# Patient Record
Sex: Male | Born: 1970
Health system: Southern US, Community
[De-identification: ages and names within clinical notes are randomized; demographics above are authoritative.]

## PROBLEM LIST (undated history)

## (undated) DIAGNOSIS — K5792 Diverticulitis of intestine, part unspecified, without perforation or abscess without bleeding: Secondary | ICD-10-CM

## (undated) DIAGNOSIS — B009 Herpesviral infection, unspecified: Secondary | ICD-10-CM

## (undated) HISTORY — PX: OTHER SURGICAL HISTORY: SHX169

---

## 2002-01-26 ENCOUNTER — Emergency Department (HOSPITAL_COMMUNITY): Admission: EM | Admit: 2002-01-26 | Discharge: 2002-01-26 | Payer: Self-pay | Admitting: Emergency Medicine

## 2003-04-21 ENCOUNTER — Emergency Department (HOSPITAL_COMMUNITY): Admission: EM | Admit: 2003-04-21 | Discharge: 2003-04-21 | Payer: Self-pay | Admitting: *Deleted

## 2003-04-21 ENCOUNTER — Encounter: Payer: Self-pay | Admitting: Emergency Medicine

## 2003-05-01 ENCOUNTER — Emergency Department (HOSPITAL_COMMUNITY): Admission: EM | Admit: 2003-05-01 | Discharge: 2003-05-01 | Payer: Self-pay | Admitting: Emergency Medicine

## 2007-03-16 ENCOUNTER — Encounter: Admission: RE | Admit: 2007-03-16 | Discharge: 2007-03-16 | Payer: Self-pay | Admitting: Family Medicine

## 2013-04-16 ENCOUNTER — Emergency Department (HOSPITAL_COMMUNITY)
Admission: EM | Admit: 2013-04-16 | Discharge: 2013-04-16 | Disposition: A | Discharge status: Home / Self Care | Payer: BC Managed Care – PPO | Attending: Emergency Medicine | Admitting: Emergency Medicine

## 2013-04-16 ENCOUNTER — Encounter (HOSPITAL_COMMUNITY): Payer: Self-pay | Admitting: Emergency Medicine

## 2013-04-16 DIAGNOSIS — R109 Unspecified abdominal pain: Secondary | ICD-10-CM

## 2013-04-16 DIAGNOSIS — Z87891 Personal history of nicotine dependence: Secondary | ICD-10-CM | POA: Insufficient documentation

## 2013-04-16 NOTE — ED Notes (Signed)
Pt presents to ED with a  Complaint of abdominal pain x 3 days.  Pt indicates that the pain arrives in waves at his lower medial abdomen and upper pelvis area.  Pt describes pain as sore.

## 2013-04-16 NOTE — ED Notes (Signed)
Pt states he has waves of pain in lower abdomen and it is sore to touch,  "feels like gas",  Pt states his stools have changed some they aren't loose but they are not solid.

## 2013-04-16 NOTE — ED Provider Notes (Signed)
CSN: [id number]     Arrival date & time 04/16/13  2050 History   First MD Initiated Contact with Patient 04/16/13 2143     Chief Complaint  Patient presents with  . Abdominal Pain   (Consider location/radiation/quality/duration/timing/severity/associated sxs/prior Treatment) HPI Comments: MORTON Tyrone Costa is a 42 y.o. Male who developed crampy abdominal pain. 3 days ago, associated with decreased stooling. He denies nausea, vomiting, fever, chills, rectal bleeding, dysuria, urinary frequency, or weakness. He has been able to eat, but is somewhat less hungry today. The pain is in his low mid abdomen. He has not had this previously. There are no other known modifying factors.   Patient is a 42 y.o. male presenting with abdominal pain. The history is provided by the patient.  Abdominal Pain   History reviewed. No pertinent past medical history. History reviewed. No pertinent past surgical history. History reviewed. No pertinent family history. History  Substance Use Topics  . Smoking status: Former Games developer  . Smokeless tobacco: Not on file  . Alcohol Use: Yes    Review of Systems  Gastrointestinal: Positive for abdominal pain.  All other systems reviewed and are negative.    Allergies  Review of patient's allergies indicates no known allergies.  Home Medications  No current outpatient prescriptions on file. BP 131/85  Pulse 100  Temp(Src) 99.3 F (37.4 C) (Oral)  Resp 16  SpO2 96% Physical Exam  Nursing note and vitals reviewed. Constitutional: He is oriented to person, place, and time. He appears well-developed and well-nourished. No distress.  HENT:  Head: Normocephalic and atraumatic.  Right Ear: External ear normal.  Left Ear: External ear normal.  Eyes: Conjunctivae and EOM are normal. Pupils are equal, round, and reactive to light.  Neck: Normal range of motion and phonation normal. Neck supple.  Cardiovascular: Normal rate, regular rhythm, normal heart sounds and  intact distal pulses.   Pulmonary/Chest: Effort normal and breath sounds normal. He exhibits no bony tenderness.  Abdominal: Soft. Normal appearance. He exhibits no mass. There is tenderness (Mild mid suprapubic tenderness). There is no rebound and no guarding.  Genitourinary:  Is normal. Digital rectal examination reveals brown stool; and small, firm prostate, which is nontender.  Musculoskeletal: Normal range of motion.  Neurological: He is alert and oriented to person, place, and time. He has normal strength. No cranial nerve deficit or sensory deficit. He exhibits normal muscle tone. Coordination normal.  Skin: Skin is warm, dry and intact.  Psychiatric: He has a normal mood and affect. His behavior is normal. Judgment and thought content normal.    ED Course  Procedures (including critical care time)  Patient was interested in an extensive discussion about possibilities, and risk factor for serious disease. I discussed getting CT scan to further evaluate and differentiate the physical findings. He felt like his pain was low, 4/10, at 2200 hours. He did not feel he needed further evaluation, at this time. He has access to followup with his primary care physician at [facility]. He would prefer following up with them if his symptoms do not improve. We discussed the possibility of decreased stooling frequency as being involved with his discomfort. He is going to try a stool softener, and oral OTC analgesia for the discomfort. We discussed worrisome factors, that should warn him to seek care; these would include increased pain, rectal bleeding, vomiting, fever, weakness.    MDM   1. Abdominal pain, acute      Nonspecific low abdominal pain. The worrisome  findings on physical examination. Doubt UTI, prostatitis, gastroenteritis, colitis, appendicitis. He, stable for discharge with expectant management and followup  Nursing Notes Reviewed/ Care Coordinated, and agree without  changes. Applicable Imaging Reviewed.  Interpretation of Laboratory Data incorporated into ED treatment   Plan: Home Medications- Colace for 3-4 days, Tylenol/Advil prn pain; Home Treatments and Observation- Rest, Fluids; return here if the recommended treatment, does not improve the symptoms; Recommended follow up- PCP prn, return here if needed.      Flint Melter, MD 04/16/13 2215

## 2015-11-01 ENCOUNTER — Encounter (HOSPITAL_COMMUNITY): Payer: Self-pay | Admitting: Emergency Medicine

## 2015-11-01 ENCOUNTER — Emergency Department (HOSPITAL_COMMUNITY): Payer: BLUE CROSS/BLUE SHIELD

## 2015-11-01 ENCOUNTER — Inpatient Hospital Stay (HOSPITAL_COMMUNITY)
Admission: EM | Admit: 2015-11-01 | Discharge: 2015-11-02 | DRG: 392 | Disposition: A | Discharge status: Home / Self Care | Payer: BLUE CROSS/BLUE SHIELD | Attending: Internal Medicine | Admitting: Internal Medicine

## 2015-11-01 DIAGNOSIS — K5792 Diverticulitis of intestine, part unspecified, without perforation or abscess without bleeding: Secondary | ICD-10-CM | POA: Diagnosis present

## 2015-11-01 DIAGNOSIS — A6000 Herpesviral infection of urogenital system, unspecified: Secondary | ICD-10-CM | POA: Diagnosis not present

## 2015-11-01 DIAGNOSIS — Z8709 Personal history of other diseases of the respiratory system: Secondary | ICD-10-CM | POA: Diagnosis not present

## 2015-11-01 DIAGNOSIS — K5720 Diverticulitis of large intestine with perforation and abscess without bleeding: Secondary | ICD-10-CM | POA: Diagnosis not present

## 2015-11-01 DIAGNOSIS — Z8619 Personal history of other infectious and parasitic diseases: Secondary | ICD-10-CM | POA: Diagnosis not present

## 2015-11-01 DIAGNOSIS — R031 Nonspecific low blood-pressure reading: Secondary | ICD-10-CM | POA: Diagnosis present

## 2015-11-01 DIAGNOSIS — K5732 Diverticulitis of large intestine without perforation or abscess without bleeding: Secondary | ICD-10-CM | POA: Diagnosis not present

## 2015-11-01 DIAGNOSIS — Z87891 Personal history of nicotine dependence: Secondary | ICD-10-CM

## 2015-11-01 DIAGNOSIS — R1032 Left lower quadrant pain: Secondary | ICD-10-CM | POA: Diagnosis not present

## 2015-11-01 DIAGNOSIS — J45909 Unspecified asthma, uncomplicated: Secondary | ICD-10-CM

## 2015-11-01 HISTORY — DX: Herpesviral infection, unspecified: B00.9

## 2015-11-01 LAB — COMPREHENSIVE METABOLIC PANEL
ALT: 41 U/L (ref 17–63)
AST: 30 U/L (ref 15–41)
Albumin: 3.9 g/dL (ref 3.5–5.0)
Alkaline Phosphatase: 46 U/L (ref 38–126)
Anion gap: 13 (ref 5–15)
BUN: 6 mg/dL (ref 6–20)
CO2: 24 mmol/L (ref 22–32)
Calcium: 9.2 mg/dL (ref 8.9–10.3)
Chloride: 104 mmol/L (ref 101–111)
Creatinine, Ser: 0.94 mg/dL (ref 0.61–1.24)
GFR calc Af Amer: >60 mL/min (ref >60)
GFR calc non Af Amer: >60 mL/min (ref >60)
Glucose, Bld: 111 mg/dL — ABNORMAL HIGH (ref 65–99)
Potassium: 4.1 mmol/L (ref 3.5–5.1)
Sodium: 141 mmol/L (ref 135–145)
Total Bilirubin: 0.8 mg/dL (ref 0.3–1.2)
Total Protein: 7.4 g/dL (ref 6.5–8.1)

## 2015-11-01 LAB — CBC
HCT: 42.9 % (ref 39.0–52.0)
Hemoglobin: 14.9 g/dL (ref 13.0–17.0)
MCH: 32.3 pg (ref 26.0–34.0)
MCHC: 34.7 g/dL (ref 30.0–36.0)
MCV: 92.9 fL (ref 78.0–100.0)
Platelets: 312 10*3/uL (ref 150–400)
RBC: 4.62 MIL/uL (ref 4.22–5.81)
RDW: 12.1 % (ref 11.5–15.5)
WBC: 10.3 10*3/uL (ref 4.0–10.5)

## 2015-11-01 LAB — URINALYSIS, ROUTINE W REFLEX MICROSCOPIC
Bilirubin Urine: NEGATIVE
Glucose, UA: NEGATIVE mg/dL
Hgb urine dipstick: NEGATIVE
Ketones, ur: NEGATIVE mg/dL
Leukocytes, UA: NEGATIVE
Nitrite: NEGATIVE
Protein, ur: NEGATIVE mg/dL
Specific Gravity, Urine: 1.003 — ABNORMAL LOW (ref 1.005–1.030)
pH: 6.5 (ref 5.0–8.0)

## 2015-11-01 LAB — DIFFERENTIAL
Basophils Absolute: 0 10*3/uL (ref 0.0–0.1)
Basophils Relative: 0 %
Eosinophils Absolute: 0.2 10*3/uL (ref 0.0–0.7)
Eosinophils Relative: 2 %
Lymphocytes Relative: 24 %
Lymphs Abs: 2.5 10*3/uL (ref 0.7–4.0)
Monocytes Absolute: 1.2 10*3/uL — ABNORMAL HIGH (ref 0.1–1.0)
Monocytes Relative: 12 %
Neutro Abs: 6.4 10*3/uL (ref 1.7–7.7)
Neutrophils Relative %: 62 %

## 2015-11-01 LAB — LIPASE, BLOOD: Lipase: 21 U/L (ref 11–51)

## 2015-11-01 MED ORDER — IOHEXOL 300 MG/ML  SOLN
50.0000 mL | Freq: Once | INTRAMUSCULAR | Status: AC | PRN
  Administered 2015-11-01: 50 mL via ORAL

## 2015-11-01 MED ORDER — ENOXAPARIN SODIUM 40 MG/0.4ML ~~LOC~~ SOLN
40.0000 mg | SUBCUTANEOUS | Status: DC
  Administered 2015-11-01: 40 mg via SUBCUTANEOUS
  Filled 2015-11-01 (×2): qty 0.4

## 2015-11-01 MED ORDER — ONDANSETRON HCL 4 MG/2ML IJ SOLN: 4.0000 mg | Freq: Three times a day (TID) | INTRAMUSCULAR | Status: DC | PRN

## 2015-11-01 MED ORDER — SODIUM CHLORIDE 0.9% FLUSH
3.0000 mL | Freq: Two times a day (BID) | INTRAVENOUS | Status: DC
  Administered 2015-11-01: 3 mL via INTRAVENOUS

## 2015-11-01 MED ORDER — ONDANSETRON HCL 4 MG/2ML IJ SOLN: 4.0000 mg | Freq: Four times a day (QID) | INTRAMUSCULAR | Status: DC | PRN

## 2015-11-01 MED ORDER — HYDROMORPHONE HCL 1 MG/ML IJ SOLN: 1.0000 mg | INTRAMUSCULAR | Status: DC | PRN

## 2015-11-01 MED ORDER — SODIUM CHLORIDE 0.9% FLUSH: 3.0000 mL | INTRAVENOUS | Status: DC | PRN

## 2015-11-01 MED ORDER — ALBUTEROL SULFATE HFA 108 (90 BASE) MCG/ACT IN AERS: 2.0000 | INHALATION_SPRAY | Freq: Four times a day (QID) | RESPIRATORY_TRACT | Status: DC | PRN

## 2015-11-01 MED ORDER — ONDANSETRON HCL 4 MG PO TABS: 4.0000 mg | ORAL_TABLET | Freq: Four times a day (QID) | ORAL | Status: DC | PRN

## 2015-11-01 MED ORDER — ALBUTEROL SULFATE (2.5 MG/3ML) 0.083% IN NEBU: 2.5000 mg | INHALATION_SOLUTION | Freq: Four times a day (QID) | RESPIRATORY_TRACT | Status: DC | PRN

## 2015-11-01 MED ORDER — PIPERACILLIN-TAZOBACTAM 3.375 G IVPB
3.3750 g | Freq: Three times a day (TID) | INTRAVENOUS | Status: DC
  Administered 2015-11-01 – 2015-11-02 (×2): 3.375 g via INTRAVENOUS
  Filled 2015-11-01 (×3): qty 50

## 2015-11-01 MED ORDER — HYDROMORPHONE HCL 1 MG/ML IJ SOLN: 0.5000 mg | INTRAMUSCULAR | Status: DC | PRN

## 2015-11-01 MED ORDER — IOPAMIDOL (ISOVUE-300) INJECTION 61%
100.0000 mL | Freq: Once | INTRAVENOUS | Status: AC | PRN
  Administered 2015-11-01: 100 mL via INTRAVENOUS

## 2015-11-01 MED ORDER — ACETAMINOPHEN 325 MG PO TABS: 650.0000 mg | ORAL_TABLET | Freq: Four times a day (QID) | ORAL | Status: DC | PRN

## 2015-11-01 MED ORDER — MORPHINE SULFATE (PF) 2 MG/ML IV SOLN: 2.0000 mg | INTRAVENOUS | Status: DC | PRN

## 2015-11-01 MED ORDER — SODIUM CHLORIDE 0.9 % IV SOLN: 250.0000 mL | INTRAVENOUS | Status: DC | PRN

## 2015-11-01 MED ORDER — PIPERACILLIN-TAZOBACTAM 3.375 G IVPB 30 MIN
3.3750 g | Freq: Once | INTRAVENOUS | Status: AC
  Administered 2015-11-01: 3.375 g via INTRAVENOUS
  Filled 2015-11-01: qty 50

## 2015-11-01 MED ORDER — ACETAMINOPHEN 650 MG RE SUPP: 650.0000 mg | Freq: Four times a day (QID) | RECTAL | Status: DC | PRN

## 2015-11-01 NOTE — ED Notes (Signed)
MD at bedside. EDP J EVALUATING PT

## 2015-11-01 NOTE — ED Notes (Signed)
EDPA JOSH  at bedside. Continue with Zosyn

## 2015-11-01 NOTE — ED Provider Notes (Signed)
CSN: [id number]     Arrival date & time 11/01/15  0935 History   First MD Initiated Contact with Patient 11/01/15 [phone]     Chief Complaint  Patient presents with  . Abdominal Pain     (Consider location/radiation/quality/duration/timing/severity/associated sxs/prior Treatment) HPI Comments: Patient with no past surgical history presents with complaint of lower abdominal pain beginning 11 days ago. Pain is low in his abdomen, suprapubic to slightly left-sided. It does not otherwise radiate. Patient has transiently worsening pain with urination and bowel movements. No blood in either. No true dysuria. Patient denies fever. He was treated empirically for diverticulitis in the past but has never had a colonoscopy or CT scan. No history of prostatitis. No treatments prior to arrival. Patient states that his pain originally worsened and then resolved about a week ago, however over the past 2-3 days the pain has returned and become worse. The onset of this condition was acute. The course is constant. Aggravating factors: none. Alleviating factors: none.    Patient is a 45 y.o. male presenting with abdominal pain. The history is provided by the patient.  Abdominal Pain Associated symptoms: no chest pain, no cough, no diarrhea, no dysuria, no fever, no nausea, no sore throat and no vomiting     History reviewed. No pertinent past medical history. History reviewed. No pertinent past surgical history. History reviewed. No pertinent family history. Social History  Substance Use Topics  . Smoking status: Former Games developermoker  . Smokeless tobacco: None  . Alcohol Use: Yes    Review of Systems  Constitutional: Negative for fever.  HENT: Negative for rhinorrhea and sore throat.   Eyes: Negative for redness.  Respiratory: Negative for cough.   Cardiovascular: Negative for chest pain.  Gastrointestinal: Positive for abdominal pain. Negative for nausea, vomiting and diarrhea.  Genitourinary: Negative for  dysuria, frequency, discharge, penile swelling, scrotal swelling, penile pain and testicular pain.  Musculoskeletal: Negative for myalgias.  Skin: Negative for rash.  Neurological: Negative for headaches.    Allergies  Review of patient's allergies indicates no known allergies.  Home Medications   Prior to Admission medications   Medication Sig Start Date End Date Taking? Authorizing Provider  acetaminophen (TYLENOL) 500 MG tablet Take 1,000 mg by mouth every 6 (six) hours as needed for moderate pain.   Yes Historical Provider, MD  albuterol (PROVENTIL HFA;VENTOLIN HFA) 108 (90 Base) MCG/ACT inhaler Inhale 2 puffs into the lungs every 6 (six) hours as needed for wheezing or shortness of breath.   Yes Historical Provider, MD  Ascorbic Acid (VITAMIN C PO) Take 1 tablet by mouth daily.   Yes Historical Provider, MD  B Complex Vitamins (VITAMIN B COMPLEX) TABS Take 1 tablet by mouth daily.   Yes Historical Provider, MD  valACYclovir (VALTREX) 500 MG tablet Take 500 mg by mouth 2 (two) times daily as needed (outbreaks).   Yes Historical Provider, MD   BP 146/106 mmHg  Pulse 102  Temp(Src) 97.5 F (36.4 C) (Oral)  Resp 14  SpO2 99%   Physical Exam  Constitutional: He appears well-developed and well-nourished.  HENT:  Head: Normocephalic and atraumatic.  Eyes: Conjunctivae are normal. Right eye exhibits no discharge. Left eye exhibits no discharge.  Neck: Normal range of motion. Neck supple.  Cardiovascular: Normal rate, regular rhythm and normal heart sounds.   Pulmonary/Chest: Effort normal and breath sounds normal.  Abdominal: Soft. There is tenderness in the suprapubic area and left lower quadrant. There is no rigidity, no rebound, no guarding,  no CVA tenderness, no tenderness at McBurney's point and negative Murphy's sign.    Neurological: He is alert.  Skin: Skin is warm and dry.  Psychiatric: He has a normal mood and affect.  Nursing note and vitals reviewed.   ED Course    Procedures (including critical care time) Labs Review Labs Reviewed  COMPREHENSIVE METABOLIC PANEL - Abnormal; Notable for the following:    Glucose, Bld 111 (*)    All other components within normal limits  URINALYSIS, ROUTINE W REFLEX MICROSCOPIC (NOT AT [facility]) - Abnormal; Notable for the following:    Specific Gravity, Urine 1.003 (*)    All other components within normal limits  DIFFERENTIAL - Abnormal; Notable for the following:    Monocytes Absolute 1.2 (*)    All other components within normal limits  LIPASE, BLOOD  CBC    Imaging Review Ct Abdomen Pelvis W Contrast  11/01/2015  CLINICAL DATA:  Left lower quadrant abdominal pain. EXAM: CT ABDOMEN AND PELVIS WITH CONTRAST TECHNIQUE: Multidetector CT imaging of the abdomen and pelvis was performed using the standard protocol following bolus administration of intravenous contrast. CONTRAST:  ISOVUE-300 IOPAMIDOL (ISOVUE-300) INJECTION 61% COMPARISON:  None. FINDINGS: Visualized lung bases are unremarkable. No significant osseous abnormality is noted. No gallstones are noted. The liver, spleen and pancreas unremarkable. Adrenal glands and kidneys appear normal. No hydronephrosis or renal obstruction is noted. The appendix appears normal. There is no evidence of bowel obstruction. Severe focal wall thickening and surrounding inflammation is seen involving the sigmoid colon consistent with diverticulitis. 25 x 12 mm fluid collection is seen adjacent to the colon which may represent small abscess. This is not amenable to percutaneous drainage. Urinary bladder is unremarkable. No significant adenopathy is noted. IMPRESSION: Sigmoid diverticulitis is noted. 25 x 12 mm fluid collection is seen adjacent to the sigmoid colon concerning for diverticular abscess. This is not amenable to percutaneous drainage. Electronically Signed   By: Lupita Raider, M.D.   On: 11/01/2015 12:57   I have personally reviewed and evaluated these images and lab  results as part of my medical decision-making.  10:21 AM Patient seen and examined. Work-up initiated. Medications ordered.   Vital signs reviewed and are as follows: BP 146/106 mmHg  Pulse 102  Temp(Src) 97.5 F (36.4 C) (Oral)  Resp 14  SpO2 99%  11:12 AM Patient stable. He is up in room, standing, working on laptop. Labs do not reveal cause of symptoms. I feel given recurrent nature of pain without explanation, that CT is next step to look for etiology and r/o diverticulitis. Discussed with patient and he agrees to proceed.   1:27 PM CT reviewed with patient. Will need admission for IV abx, ordered. Discussed with Dr. Ethelda Chick.   2:17 PM Spoke with Dr. Catha Gosselin. Admit med-surg obs  3:23 PM Called to patient room. Patient supine with pallor, fully responsive, diaphoretic. Patient had nausea and urge to use restroom prior. Slowly improving now. BP was low. No abdominal pain or bleeding.   MDM   Final diagnoses:  Diverticulitis of large intestine with abscess without bleeding   Admit.    Renne Crigler, PA-C 11/01/15 1417  Renne Crigler, PA-C 11/01/15 1524  Doug Sou, MD 11/01/15 [phone]

## 2015-11-01 NOTE — ED Notes (Addendum)
Tyrone DimmerKim H RN requested I evaluate pt. Pt c/o of nausea and "just not feeling right". I diaphoretic and requesting to use bathroom. Zosyn infusion stopped. Vitals recorded. Pt requested to sit up and use bathroom. BP systolic 55.  [facility] Assisted pt to lying position. EDPA Josh present to witness pt's current status. Admitting MD to

## 2015-11-01 NOTE — ED Notes (Signed)
ED PA at bedside

## 2015-11-01 NOTE — Progress Notes (Signed)
Pharmacy Antibiotic Note  Tyrone Costa is a 45 y.o. male admitted on 11/01/2015 with diverticulitis.  Pharmacy has been consulted for zosyn dosing.  Plan:  Zosyn 3.375g IV Q8H infused over 4hrs.  Do not anticipate any renal dose adjustment will be needed, therefore pharmacy will sign off.  Please re-consult if indicated.  Height: 5\' 7"  (170.2 cm) Weight: 165 lb (74.844 kg) IBW/kg (Calculated) : 66.1  Temp (24hrs), Avg:97.9 F (36.6 C), Min:97.5 F (36.4 C), Max:98.3 F (36.8 C)   Recent Labs Lab 11/01/15 1024  WBC 10.3  CREATININE 0.94    Estimated Creatinine Clearance: 93.8 mL/min (by C-G formula based on Cr of 0.94).    No Known Allergies  Antimicrobials this admission: 4/7 zosyn Dose adjustments this admission:   Microbiology results:   Thank you for allowing pharmacy to be a part of this patient's care.  Arley PhenixEllen Doninique Lwin RPh 11/01/2015, 4:40 PM Pager [phone]

## 2015-11-01 NOTE — H&P (Signed)
[facility] History and Physical  Tyrone Crankerhillip V Tremblay UUV:[id number] DOB: 1971/06/28 DOA: 11/01/2015  Referring physician: Mr. Renne CriglerJoshua Geiple, PA EDP PCP: No primary care provider on file.  Specialists:   Chief Complaint: Abdominal pain  HPI: Tyrone Costa is a 45 y.o. male  With history of possible asthma who presented to the emergency department with complaint of abdominal pain that been ongoing for approximately 2 weeks. Patient states the pain would come and go, last a few seconds at a time and dissipate for hours to days. Pain was located in the suprapubic, left lower quadrant area. Patient has been able to tolerate food and liquids without any complaints of nausea. Patient does have a history of diverticulitis but it never had colonoscopy or CT scan. Patient denies any current nausea, vomiting, chest pain, shortness of breath, ill contacts or recent travel. While in the emergency department, CT scan showed sigmoid diverticulitis with abscess. Patient was started on Zosyn. Patient also had transient hypotension preceding bowel movement. Patient's vitals currently stable on admission. [facility] called for admission.  Review of Systems:  Constitutional: Denies fever, chills, diaphoresis, appetite change and fatigue.  HEENT: Denies photophobia, eye pain, redness, hearing loss, ear pain, congestion, sore throat, rhinorrhea, sneezing, mouth sores, trouble swallowing, neck pain, neck stiffness and tinnitus.   Respiratory: Denies SOB, DOE, cough, chest tightness,  and wheezing.   Cardiovascular: Denies chest pain, palpitations and leg swelling.  Gastrointestinal: Complains of abdominal pain. Genitourinary: Denies dysuria, urgency, frequency, hematuria, flank pain and difficulty urinating.  Musculoskeletal: Denies myalgias, back pain, joint swelling, arthralgias and gait problem.  Skin: Denies pallor, rash and wound.  Neurological: Denies dizziness, seizures, syncope, weakness, light-headedness,  numbness and headaches.  Hematological: Denies adenopathy. Easy bruising, personal or family bleeding history  Psychiatric/Behavioral: Denies suicidal ideation, mood changes, confusion, nervousness, sleep disturbance and agitation  Past Medical History  Diagnosis Date  . Herpes    Past Surgical History  Procedure Laterality Date  . Left arm surgery    . Genital herpes     Social History:  reports that he has quit smoking. He has never used smokeless tobacco. He reports that he drinks alcohol. He reports that he does not use illicit drugs.   No Known Allergies  History reviewed. No pertinent family history. no family history of hypertension, coronary artery disease, diabetes.  Prior to Admission medications   Medication Sig Start Date End Date Taking? Authorizing Provider  acetaminophen (TYLENOL) 500 MG tablet Take 1,000 mg by mouth every 6 (six) hours as needed for moderate pain.   Yes Historical Provider, MD  albuterol (PROVENTIL HFA;VENTOLIN HFA) 108 (90 Base) MCG/ACT inhaler Inhale 2 puffs into the lungs every 6 (six) hours as needed for wheezing or shortness of breath.   Yes Historical Provider, MD  Ascorbic Acid (VITAMIN C PO) Take 1 tablet by mouth daily.   Yes Historical Provider, MD  B Complex Vitamins (VITAMIN B COMPLEX) TABS Take 1 tablet by mouth daily.   Yes Historical Provider, MD  valACYclovir (VALTREX) 500 MG tablet Take 500 mg by mouth 2 (two) times daily as needed (outbreaks).   Yes Historical Provider, MD   Physical Exam: Filed Vitals:   11/01/15 1600 11/01/15 1630  BP: 131/85 123/74  Pulse: 74 75  Temp:    Resp: 17 17     General: Well developed, well nourished, NAD, appears stated age  HEENT: NCAT, PERRLA, EOMI, Anicteic Sclera, mucous membranes moist.   Neck: Supple, no JVD, no masses  Cardiovascular: S1 S2 auscultated, no rubs, murmurs or gallops. Regular rate and rhythm.  Respiratory: Clear to auscultation bilaterally with equal chest  rise  Abdomen: Soft, left lower quadrant/suprapubic TTP, nondistended, + bowel sounds  Extremities: warm dry without cyanosis clubbing or edema  Neuro: AAOx3, cranial nerves grossly intact. Strength 5/5 in patient's upper and lower extremities bilaterally  Skin: Without rashes exudates or nodules  Psych: Normal affect and demeanor with intact judgement and insight  Labs on Admission:  Basic Metabolic Panel:  Recent Labs Lab 11/01/15 1024  NA 141  K 4.1  CL 104  CO2 24  GLUCOSE 111*  BUN 6  CREATININE 0.94  CALCIUM 9.2   Liver Function Tests:  Recent Labs Lab 11/01/15 1024  AST 30  ALT 41  ALKPHOS 46  BILITOT 0.8  PROT 7.4  ALBUMIN 3.9    Recent Labs Lab 11/01/15 1024  LIPASE 21   No results for input(s): AMMONIA in the last 168 hours. CBC:  Recent Labs Lab 11/01/15 1024  WBC 10.3  NEUTROABS 6.4  HGB 14.9  HCT 42.9  MCV 92.9  PLT 312   Cardiac Enzymes: No results for input(s): CKTOTAL, CKMB, CKMBINDEX, TROPONINI in the last 168 hours.  BNP (last 3 results) No results for input(s): BNP in the last 8760 hours.  ProBNP (last 3 results) No results for input(s): PROBNP in the last 8760 hours.  CBG: No results for input(s): GLUCAP in the last 168 hours.  Radiological Exams on Admission: Ct Abdomen Pelvis W Contrast  11/01/2015  CLINICAL DATA:  Left lower quadrant abdominal pain. EXAM: CT ABDOMEN AND PELVIS WITH CONTRAST TECHNIQUE: Multidetector CT imaging of the abdomen and pelvis was performed using the standard protocol following bolus administration of intravenous contrast. CONTRAST:  ISOVUE-300 IOPAMIDOL (ISOVUE-300) INJECTION 61% COMPARISON:  None. FINDINGS: Visualized lung bases are unremarkable. No significant osseous abnormality is noted. No gallstones are noted. The liver, spleen and pancreas unremarkable. Adrenal glands and kidneys appear normal. No hydronephrosis or renal obstruction is noted. The appendix appears normal. There is no  evidence of bowel obstruction. Severe focal wall thickening and surrounding inflammation is seen involving the sigmoid colon consistent with diverticulitis. 25 x 12 mm fluid collection is seen adjacent to the colon which may represent small abscess. This is not amenable to percutaneous drainage. Urinary bladder is unremarkable. No significant adenopathy is noted. IMPRESSION: Sigmoid diverticulitis is noted. 25 x 12 mm fluid collection is seen adjacent to the sigmoid colon concerning for diverticular abscess. This is not amenable to percutaneous drainage. Electronically Signed   By: Lupita Raider, M.D.   On: 11/01/2015 12:57    EKG: None  Assessment/Plan  Abdominal pain secondary to sigmoid diverticulitis with abscess -CT abdomen and pelvis: Sigmoid diverticulitis with 25 x 12 mm fluid collection, concerning for diverticular abscess. Not amendable to percutaneous drainage. -Patient started on Zosyn as ER thought this to e  -Patient wishes to eat at this time. Will place on regular diet -Will likely need 2 weeks of oral antibiotics at discharge with outpatient GI follow-up.  Transient hypotension -Occurred in the emergency department prior to admission. Resolved after patient had bowel movement. -Likely vasovagal  History of genital herpes -Stable  History of asthma -Continue albuterol inhaler as needed  DVT prophylaxis: Lovenox  Code Status: Full  Condition: Guarded  Family Communication: None at bedside. Admission, patients condition and plan of care including tests being ordered have been discussed with the patient, who indicate understanding and agree  with the plan and Code Status.  Disposition Plan: Admitted.   Time spent: 50 minutes  Camree Wigington D.O. [facility] Pager [phone]  If 7PM-7AM, please contact night-coverage [email or web address] Password [email or web address] 11/01/2015, 4:55 PM

## 2015-11-01 NOTE — ED Provider Notes (Signed)
Complains of lower abdominal pain for 1.5 weeks, progressively worsening. No vomiting no fever. No other associated symptoms. Determined to have diverticulitis with abscess  Doug SouSam Francie Keeling, MD 11/01/15 1620

## 2015-11-01 NOTE — ED Notes (Signed)
MD at bedside. ADMITTING MD PRESENT 

## 2015-11-01 NOTE — ED Notes (Signed)
Pt c/o low level of constant low medial abdominal pain with intermittent exacerbations of abdominal pain worsened with voiding or defecating, and worsening randomly, diarrhea. No nausea, emesis. No dysuria, no urinary symptoms.

## 2015-11-01 NOTE — ED Notes (Signed)
CT CONTRAST COMPLETE

## 2015-11-02 DIAGNOSIS — Z8709 Personal history of other diseases of the respiratory system: Secondary | ICD-10-CM

## 2015-11-02 DIAGNOSIS — Z8619 Personal history of other infectious and parasitic diseases: Secondary | ICD-10-CM

## 2015-11-02 DIAGNOSIS — R031 Nonspecific low blood-pressure reading: Secondary | ICD-10-CM

## 2015-11-02 LAB — BASIC METABOLIC PANEL
Anion gap: 8 (ref 5–15)
BUN: 8 mg/dL (ref 6–20)
CO2: 29 mmol/L (ref 22–32)
Calcium: 8.8 mg/dL — ABNORMAL LOW (ref 8.9–10.3)
Chloride: 102 mmol/L (ref 101–111)
Creatinine, Ser: 1.17 mg/dL (ref 0.61–1.24)
GFR calc Af Amer: >60 mL/min (ref >60)
GFR calc non Af Amer: >60 mL/min (ref >60)
Glucose, Bld: 121 mg/dL — ABNORMAL HIGH (ref 65–99)
Potassium: 4 mmol/L (ref 3.5–5.1)
Sodium: 139 mmol/L (ref 135–145)

## 2015-11-02 LAB — CBC
HCT: 41.7 % (ref 39.0–52.0)
Hemoglobin: 14.4 g/dL (ref 13.0–17.0)
MCH: 32.7 pg (ref 26.0–34.0)
MCHC: 34.5 g/dL (ref 30.0–36.0)
MCV: 94.8 fL (ref 78.0–100.0)
Platelets: 332 10*3/uL (ref 150–400)
RBC: 4.4 MIL/uL (ref 4.22–5.81)
RDW: 12.2 % (ref 11.5–15.5)
WBC: 9.8 10*3/uL (ref 4.0–10.5)

## 2015-11-02 MED ORDER — METRONIDAZOLE 500 MG PO TABS
500.0000 mg | ORAL_TABLET | Freq: Three times a day (TID) | ORAL | Status: DC
  Administered 2015-11-02: 500 mg via ORAL
  Filled 2015-11-02 (×4): qty 1

## 2015-11-02 MED ORDER — CIPROFLOXACIN HCL 500 MG PO TABS
500.0000 mg | ORAL_TABLET | Freq: Two times a day (BID) | ORAL | Status: DC
  Administered 2015-11-02: 500 mg via ORAL
  Filled 2015-11-02 (×3): qty 1

## 2015-11-02 MED ORDER — METRONIDAZOLE 500 MG PO TABS: 500.0000 mg | ORAL_TABLET | Freq: Three times a day (TID) | ORAL | Status: DC

## 2015-11-02 MED ORDER — CIPROFLOXACIN HCL 500 MG PO TABS: 500.0000 mg | ORAL_TABLET | Freq: Two times a day (BID) | ORAL | Status: DC

## 2015-11-02 MED ORDER — ONDANSETRON HCL 4 MG PO TABS: 4.0000 mg | ORAL_TABLET | Freq: Four times a day (QID) | ORAL | Status: DC | PRN

## 2015-11-02 NOTE — Progress Notes (Signed)
Patient reports he wants to drive himself home.  Clarified with physician that patient OK to drive himself home.  Patient reports he lives nearby and drove himself to ED.  Physician orders patient may drive himself home.

## 2015-11-02 NOTE — Progress Notes (Signed)
Pt discharged.  Leaving with personal belongings and prescriptions at pharmacy to be picked up.  Pt reports understanding of discharge instructions.  Denies pain.  A&O x4.  No s/s of distress.  Walking at discharge to exit.  No complaints.

## 2015-11-02 NOTE — Discharge Instructions (Signed)
Diverticulitis  Diverticulitis is when small pockets that have formed in your colon (large intestine) become infected or swollen.  HOME CARE  · Follow your doctor's instructions.  · Follow a special diet if told by your doctor.  · When you feel better, your doctor may tell you to change your diet. You may be told to eat a lot of fiber. Fruits and vegetables are good sources of fiber. Fiber makes it easier to poop (have bowel movements).  · Take supplements or probiotics as told by your doctor.  · Only take medicines as told by your doctor.  · Keep all follow-up visits with your doctor.  GET HELP IF:  · Your pain does not get better.  · You have a hard time eating food.  · You are not pooping like normal.  GET HELP RIGHT AWAY IF:  · Your pain gets worse.  · Your problems do not get better.  · Your problems suddenly get worse.  · You have a fever.  · You keep throwing up (vomiting).  · You have bloody or black, tarry poop (stool).  MAKE SURE YOU:   · Understand these instructions.  · Will watch your condition.  · Will get help right away if you are not doing well or get worse.     This information is not intended to replace advice given to you by your health care provider. Make sure you discuss any questions you have with your health care provider.     Document Released: 12/30/2007 Document Revised: 07/18/2013 Document Reviewed: 06/07/2013  [employer]vier Interactive Patient Education ©2016 [employer]

## 2015-11-02 NOTE — Discharge Summary (Signed)
Physician Discharge Summary  Tyrone Crankerhillip V Carline ZOX:[id number] DOB: [birth year] DOA: 11/01/2015  PCP: No primary care provider on file.  Admit date: 11/01/2015 Discharge date: 11/02/2015  Time spent: 45 minutes  Recommendations for Outpatient Follow-up:  Patient will be discharged to home.  Patient will need to follow up with primary care provider within one week of discharge.  Discuss repeat CT scan of the abdomen with your PCP.  Follow up with Gastroenterology (or obtain a referral from your PCP). Patient should continue medications as prescribed.  Patient should follow a regular diet.   Discharge Diagnoses:  Abdominal pain secondary to sigmoid diverticulitis with abscess Transient hypotension History of genital herpes History of asthma  Discharge Condition: Stable  Diet recommendation: regular  Filed Weights   11/01/15 1424  Weight: 74.844 kg (165 lb)    History of present illness:  On 11/01/2015  Tyrone Costa is a 45 y.o. male with history of possible asthma who presented to the emergency department with complaint of abdominal pain that been ongoing for approximately 2 weeks. Patient states the pain would come and go, last a few seconds at a time and dissipate for hours to days. Pain was located in the suprapubic, left lower quadrant area. Patient has been able to tolerate food and liquids without any complaints of nausea. Patient does have a history of diverticulitis but it never had colonoscopy or CT scan. Patient denies any current nausea, vomiting, chest pain, shortness of breath, ill contacts or recent travel. While in the emergency department, CT scan showed sigmoid diverticulitis with abscess. Patient was started on Zosyn. Patient also had transient hypotension preceding bowel movement. Patient's vitals currently stable on admission. [facility] called for admission.  Hospital Course:  Abdominal pain secondary to sigmoid diverticulitis with abscess -CT abdomen and pelvis: Sigmoid  diverticulitis with 25 x 12 mm fluid collection, concerning for diverticular abscess. Not amendable to percutaneous drainage. -Patient was started on Zosyn as this was thought to be extreme diverticulitis due to abscess -Patient was placed on regular diet and was able to tolerate. -Will discharge patient with ciprofloxacin and Flagyl, 2 week course -Patient will need follow-up with gastroenterology for possible colonoscopy after diverticulitis has resolved.  He may need referral from his primary care physician. -Would repeat CT scan of the abdomen in 1-2 weeks to evaluate abscess. -Patient initially admitted as inpatient given the severity of his sigmoid diverticulitis with abscess noted on CT scan. He remarkably has improved to greater than expected. His abdominal pain has resolved.  Transient hypotension -Occurred in the emergency department prior to admission. Resolved after patient had bowel movement. -Likely vasovagal  History of genital herpes -Stable  History of asthma -Continue albuterol inhaler as needed  Procedures:  None  Consultations:  None  Discharge Exam: Filed Vitals:   11/01/15 2133 11/02/15 0626  BP: 131/83 112/69  Pulse: 80 75  Temp: 98.2 F (36.8 C) 97.8 F (36.6 C)  Resp: 18 18     General: Well developed, well nourished, NAD, appears stated age  HEENT: NCAT, mucous membranes moist.  Cardiovascular: S1 S2 auscultated, No murmurs, RRR  Respiratory: Clear to auscultation bilaterally   Abdomen: Soft, nontender, nondistended, + bowel sounds  Extremities: warm dry without cyanosis clubbing or edema  Neuro: AAOx3, Nonfocal  Psych: Appropriate mood and affect  Discharge Instructions      Discharge Instructions    Discharge instructions    Complete by:  As directed   Patient will be discharged to home.  Patient  will need to follow up with primary care provider within one week of discharge.  Discuss repeat CT scan of the abdomen with your PCP.   Follow up with Gastroenterology (or obtain a referral from your PCP). Patient should continue medications as prescribed.  Patient should follow a regular diet.            Medication List    TAKE these medications        acetaminophen 500 MG tablet  Commonly known as:  TYLENOL  Take 1,000 mg by mouth every 6 (six) hours as needed for moderate pain.     albuterol 108 (90 Base) MCG/ACT inhaler  Commonly known as:  PROVENTIL HFA;VENTOLIN HFA  Inhale 2 puffs into the lungs every 6 (six) hours as needed for wheezing or shortness of breath.     ciprofloxacin 500 MG tablet  Commonly known as:  CIPRO  Take 1 tablet (500 mg total) by mouth 2 (two) times daily.     metroNIDAZOLE 500 MG tablet  Commonly known as:  FLAGYL  Take 1 tablet (500 mg total) by mouth every 8 (eight) hours.     ondansetron 4 MG tablet  Commonly known as:  ZOFRAN  Take 1 tablet (4 mg total) by mouth every 6 (six) hours as needed for nausea.     valACYclovir 500 MG tablet  Commonly known as:  VALTREX  Take 500 mg by mouth 2 (two) times daily as needed (outbreaks).     Vitamin B Complex Tabs  Take 1 tablet by mouth daily.     VITAMIN C PO  Take 1 tablet by mouth daily.       No Known Allergies Follow-up Information    Follow up with Primary care provided..      Follow up with EDWARDS JR,JAMES L, MD. Schedule an appointment as soon as possible for a visit in 2 weeks.   Specialty:  Gastroenterology   Why:  Hospital follow up, Diverticulitis.   Contact information:   [address] [place] [address] [phone]        The results of significant diagnostics from this hospitalization (including imaging, microbiology, ancillary and laboratory) are listed below for reference.    Significant Diagnostic Studies: Ct Abdomen Pelvis W Contrast  11/01/2015  CLINICAL DATA:  Left lower quadrant abdominal pain. EXAM: CT ABDOMEN AND PELVIS WITH CONTRAST TECHNIQUE: Multidetector CT imaging of the  abdomen and pelvis was performed using the standard protocol following bolus administration of intravenous contrast. CONTRAST:  ISOVUE-300 IOPAMIDOL (ISOVUE-300) INJECTION 61% COMPARISON:  None. FINDINGS: Visualized lung bases are unremarkable. No significant osseous abnormality is noted. No gallstones are noted. The liver, spleen and pancreas unremarkable. Adrenal glands and kidneys appear normal. No hydronephrosis or renal obstruction is noted. The appendix appears normal. There is no evidence of bowel obstruction. Severe focal wall thickening and surrounding inflammation is seen involving the sigmoid colon consistent with diverticulitis. 25 x 12 mm fluid collection is seen adjacent to the colon which may represent small abscess. This is not amenable to percutaneous drainage. Urinary bladder is unremarkable. No significant adenopathy is noted. IMPRESSION: Sigmoid diverticulitis is noted. 25 x 12 mm fluid collection is seen adjacent to the sigmoid colon concerning for diverticular abscess. This is not amenable to percutaneous drainage. Electronically Signed   By: Lupita Raider, M.D.   On: 11/01/2015 12:57    Microbiology: No results found for this or any previous visit (from the past 240 hour(s)).  Labs: Basic Metabolic Panel:  Recent Labs Lab 11/01/15 1024 11/02/15 0556  NA 141 139  K 4.1 4.0  CL 104 102  CO2 24 29  GLUCOSE 111* 121*  BUN 6 8  CREATININE 0.94 1.17  CALCIUM 9.2 8.8*   Liver Function Tests:  Recent Labs Lab 11/01/15 1024  AST 30  ALT 41  ALKPHOS 46  BILITOT 0.8  PROT 7.4  ALBUMIN 3.9    Recent Labs Lab 11/01/15 1024  LIPASE 21   No results for input(s): AMMONIA in the last 168 hours. CBC:  Recent Labs Lab 11/01/15 1024 11/02/15 0556  WBC 10.3 9.8  NEUTROABS 6.4  --   HGB 14.9 14.4  HCT 42.9 41.7  MCV 92.9 94.8  PLT 312 332   Cardiac Enzymes: No results for input(s): CKTOTAL, CKMB, CKMBINDEX, TROPONINI in the last 168 hours. BNP: BNP  (last 3 results) No results for input(s): BNP in the last 8760 hours.  ProBNP (last 3 results) No results for input(s): PROBNP in the last 8760 hours.  CBG: No results for input(s): GLUCAP in the last 168 hours.     SignedEdsel Petrin  [facility] 11/02/2015, 9:59 AM

## 2015-11-13 DIAGNOSIS — K5732 Diverticulitis of large intestine without perforation or abscess without bleeding: Secondary | ICD-10-CM | POA: Diagnosis not present

## 2015-11-14 ENCOUNTER — Other Ambulatory Visit: Payer: Self-pay | Admitting: Family Medicine

## 2015-11-14 DIAGNOSIS — K5732 Diverticulitis of large intestine without perforation or abscess without bleeding: Secondary | ICD-10-CM

## 2015-11-19 ENCOUNTER — Ambulatory Visit
Admission: RE | Admit: 2015-11-19 | Discharge: 2015-11-19 | Disposition: A | Discharge status: Home / Self Care | Payer: BLUE CROSS/BLUE SHIELD | Source: Ambulatory Visit | Attending: Family Medicine | Admitting: Family Medicine

## 2015-11-19 DIAGNOSIS — K5732 Diverticulitis of large intestine without perforation or abscess without bleeding: Secondary | ICD-10-CM

## 2015-11-19 MED ORDER — IOPAMIDOL (ISOVUE-300) INJECTION 61%
100.0000 mL | Freq: Once | INTRAVENOUS | Status: AC | PRN
  Administered 2015-11-19: 100 mL via INTRAVENOUS

## 2015-11-27 DIAGNOSIS — K5732 Diverticulitis of large intestine without perforation or abscess without bleeding: Secondary | ICD-10-CM | POA: Diagnosis not present

## 2016-01-02 DIAGNOSIS — D120 Benign neoplasm of cecum: Secondary | ICD-10-CM | POA: Diagnosis not present

## 2016-01-02 DIAGNOSIS — K5732 Diverticulitis of large intestine without perforation or abscess without bleeding: Secondary | ICD-10-CM | POA: Diagnosis not present

## 2016-01-02 DIAGNOSIS — D126 Benign neoplasm of colon, unspecified: Secondary | ICD-10-CM | POA: Diagnosis not present

## 2016-01-02 DIAGNOSIS — K5730 Diverticulosis of large intestine without perforation or abscess without bleeding: Secondary | ICD-10-CM | POA: Diagnosis not present

## 2016-04-08 DIAGNOSIS — H1032 Unspecified acute conjunctivitis, left eye: Secondary | ICD-10-CM | POA: Diagnosis not present

## 2016-06-09 DIAGNOSIS — Z0000 Encounter for general adult medical examination without abnormal findings: Secondary | ICD-10-CM | POA: Diagnosis not present

## 2016-06-09 DIAGNOSIS — Z79899 Other long term (current) drug therapy: Secondary | ICD-10-CM | POA: Diagnosis not present

## 2016-06-09 DIAGNOSIS — Z125 Encounter for screening for malignant neoplasm of prostate: Secondary | ICD-10-CM | POA: Diagnosis not present

## 2016-06-09 DIAGNOSIS — Z23 Encounter for immunization: Secondary | ICD-10-CM | POA: Diagnosis not present

## 2016-06-09 DIAGNOSIS — Z13220 Encounter for screening for lipoid disorders: Secondary | ICD-10-CM | POA: Diagnosis not present

## 2016-06-30 DIAGNOSIS — B009 Herpesviral infection, unspecified: Secondary | ICD-10-CM | POA: Diagnosis not present

## 2017-06-25 DIAGNOSIS — Z0000 Encounter for general adult medical examination without abnormal findings: Secondary | ICD-10-CM | POA: Diagnosis not present

## 2017-06-25 DIAGNOSIS — R7301 Impaired fasting glucose: Secondary | ICD-10-CM | POA: Diagnosis not present

## 2017-06-25 DIAGNOSIS — R748 Abnormal levels of other serum enzymes: Secondary | ICD-10-CM | POA: Diagnosis not present

## 2017-06-29 DIAGNOSIS — R740 Nonspecific elevation of levels of transaminase and lactic acid dehydrogenase [LDH]: Secondary | ICD-10-CM | POA: Diagnosis not present

## 2017-06-29 DIAGNOSIS — Z0000 Encounter for general adult medical examination without abnormal findings: Secondary | ICD-10-CM | POA: Diagnosis not present

## 2017-06-29 DIAGNOSIS — R7301 Impaired fasting glucose: Secondary | ICD-10-CM | POA: Diagnosis not present

## 2017-07-12 DIAGNOSIS — R740 Nonspecific elevation of levels of transaminase and lactic acid dehydrogenase [LDH]: Secondary | ICD-10-CM | POA: Diagnosis not present

## 2017-08-27 DIAGNOSIS — E786 Lipoprotein deficiency: Secondary | ICD-10-CM | POA: Diagnosis not present

## 2017-08-27 DIAGNOSIS — R740 Nonspecific elevation of levels of transaminase and lactic acid dehydrogenase [LDH]: Secondary | ICD-10-CM | POA: Diagnosis not present

## 2017-08-27 DIAGNOSIS — D7589 Other specified diseases of blood and blood-forming organs: Secondary | ICD-10-CM | POA: Diagnosis not present

## 2017-09-28 DIAGNOSIS — R1032 Left lower quadrant pain: Secondary | ICD-10-CM | POA: Diagnosis not present

## 2017-11-03 DIAGNOSIS — J029 Acute pharyngitis, unspecified: Secondary | ICD-10-CM | POA: Diagnosis not present

## 2017-11-08 DIAGNOSIS — J020 Streptococcal pharyngitis: Secondary | ICD-10-CM | POA: Diagnosis not present

## 2017-11-17 DIAGNOSIS — J029 Acute pharyngitis, unspecified: Secondary | ICD-10-CM | POA: Diagnosis not present

## 2017-11-17 DIAGNOSIS — R220 Localized swelling, mass and lump, head: Secondary | ICD-10-CM | POA: Diagnosis not present

## 2018-07-08 DIAGNOSIS — R740 Nonspecific elevation of levels of transaminase and lactic acid dehydrogenase [LDH]: Secondary | ICD-10-CM | POA: Diagnosis not present

## 2018-07-08 DIAGNOSIS — E786 Lipoprotein deficiency: Secondary | ICD-10-CM | POA: Diagnosis not present

## 2018-07-08 DIAGNOSIS — L720 Epidermal cyst: Secondary | ICD-10-CM | POA: Diagnosis not present

## 2018-07-08 DIAGNOSIS — D7589 Other specified diseases of blood and blood-forming organs: Secondary | ICD-10-CM | POA: Diagnosis not present

## 2018-07-12 DIAGNOSIS — Z23 Encounter for immunization: Secondary | ICD-10-CM | POA: Diagnosis not present

## 2018-07-12 DIAGNOSIS — Z0000 Encounter for general adult medical examination without abnormal findings: Secondary | ICD-10-CM | POA: Diagnosis not present

## 2018-11-14 DIAGNOSIS — S70362A Insect bite (nonvenomous), left thigh, initial encounter: Secondary | ICD-10-CM | POA: Diagnosis not present

## 2018-11-17 DIAGNOSIS — S70362A Insect bite (nonvenomous), left thigh, initial encounter: Secondary | ICD-10-CM | POA: Diagnosis not present

## 2018-11-22 DIAGNOSIS — R21 Rash and other nonspecific skin eruption: Secondary | ICD-10-CM | POA: Diagnosis not present

## 2019-01-11 DIAGNOSIS — Z23 Encounter for immunization: Secondary | ICD-10-CM | POA: Diagnosis not present

## 2019-01-13 DIAGNOSIS — R1032 Left lower quadrant pain: Secondary | ICD-10-CM | POA: Diagnosis not present

## 2019-05-20 DIAGNOSIS — R432 Parageusia: Secondary | ICD-10-CM | POA: Diagnosis not present

## 2019-05-20 DIAGNOSIS — J069 Acute upper respiratory infection, unspecified: Secondary | ICD-10-CM | POA: Diagnosis not present

## 2019-05-20 DIAGNOSIS — Z20828 Contact with and (suspected) exposure to other viral communicable diseases: Secondary | ICD-10-CM | POA: Diagnosis not present

## 2019-05-26 DIAGNOSIS — U071 COVID-19: Secondary | ICD-10-CM | POA: Diagnosis not present

## 2019-05-30 ENCOUNTER — Other Ambulatory Visit: Payer: Self-pay | Admitting: Family Medicine

## 2019-05-30 ENCOUNTER — Ambulatory Visit
Admission: RE | Admit: 2019-05-30 | Discharge: 2019-05-30 | Disposition: A | Discharge status: Home / Self Care | Payer: BC Managed Care – PPO | Source: Ambulatory Visit | Attending: Family Medicine | Admitting: Family Medicine

## 2019-05-30 DIAGNOSIS — U071 COVID-19: Secondary | ICD-10-CM

## 2019-05-30 DIAGNOSIS — R05 Cough: Secondary | ICD-10-CM

## 2019-05-30 DIAGNOSIS — R0602 Shortness of breath: Secondary | ICD-10-CM | POA: Diagnosis not present

## 2019-05-30 DIAGNOSIS — R059 Cough, unspecified: Secondary | ICD-10-CM

## 2019-07-17 DIAGNOSIS — Z79899 Other long term (current) drug therapy: Secondary | ICD-10-CM | POA: Diagnosis not present

## 2019-07-17 DIAGNOSIS — E786 Lipoprotein deficiency: Secondary | ICD-10-CM | POA: Diagnosis not present

## 2019-07-17 DIAGNOSIS — R7401 Elevation of levels of liver transaminase levels: Secondary | ICD-10-CM | POA: Diagnosis not present

## 2019-07-18 DIAGNOSIS — E786 Lipoprotein deficiency: Secondary | ICD-10-CM | POA: Diagnosis not present

## 2019-07-18 DIAGNOSIS — Z79899 Other long term (current) drug therapy: Secondary | ICD-10-CM | POA: Diagnosis not present

## 2019-09-13 DIAGNOSIS — Z0000 Encounter for general adult medical examination without abnormal findings: Secondary | ICD-10-CM | POA: Diagnosis not present

## 2019-09-13 DIAGNOSIS — R7301 Impaired fasting glucose: Secondary | ICD-10-CM | POA: Diagnosis not present

## 2020-03-11 DIAGNOSIS — Z20822 Contact with and (suspected) exposure to covid-19: Secondary | ICD-10-CM | POA: Diagnosis not present

## 2020-03-11 DIAGNOSIS — Z7189 Other specified counseling: Secondary | ICD-10-CM | POA: Diagnosis not present

## 2020-06-24 IMAGING — DX DG CHEST 2V
2 series · 2 of 2 positions shown · non-contrast
Comparison: 03/16/2007

CLINICAL DATA: [id number], shortness of breath

EXAM:
CHEST - 2 VIEW

[dg chest 2 view (1 of 2)]
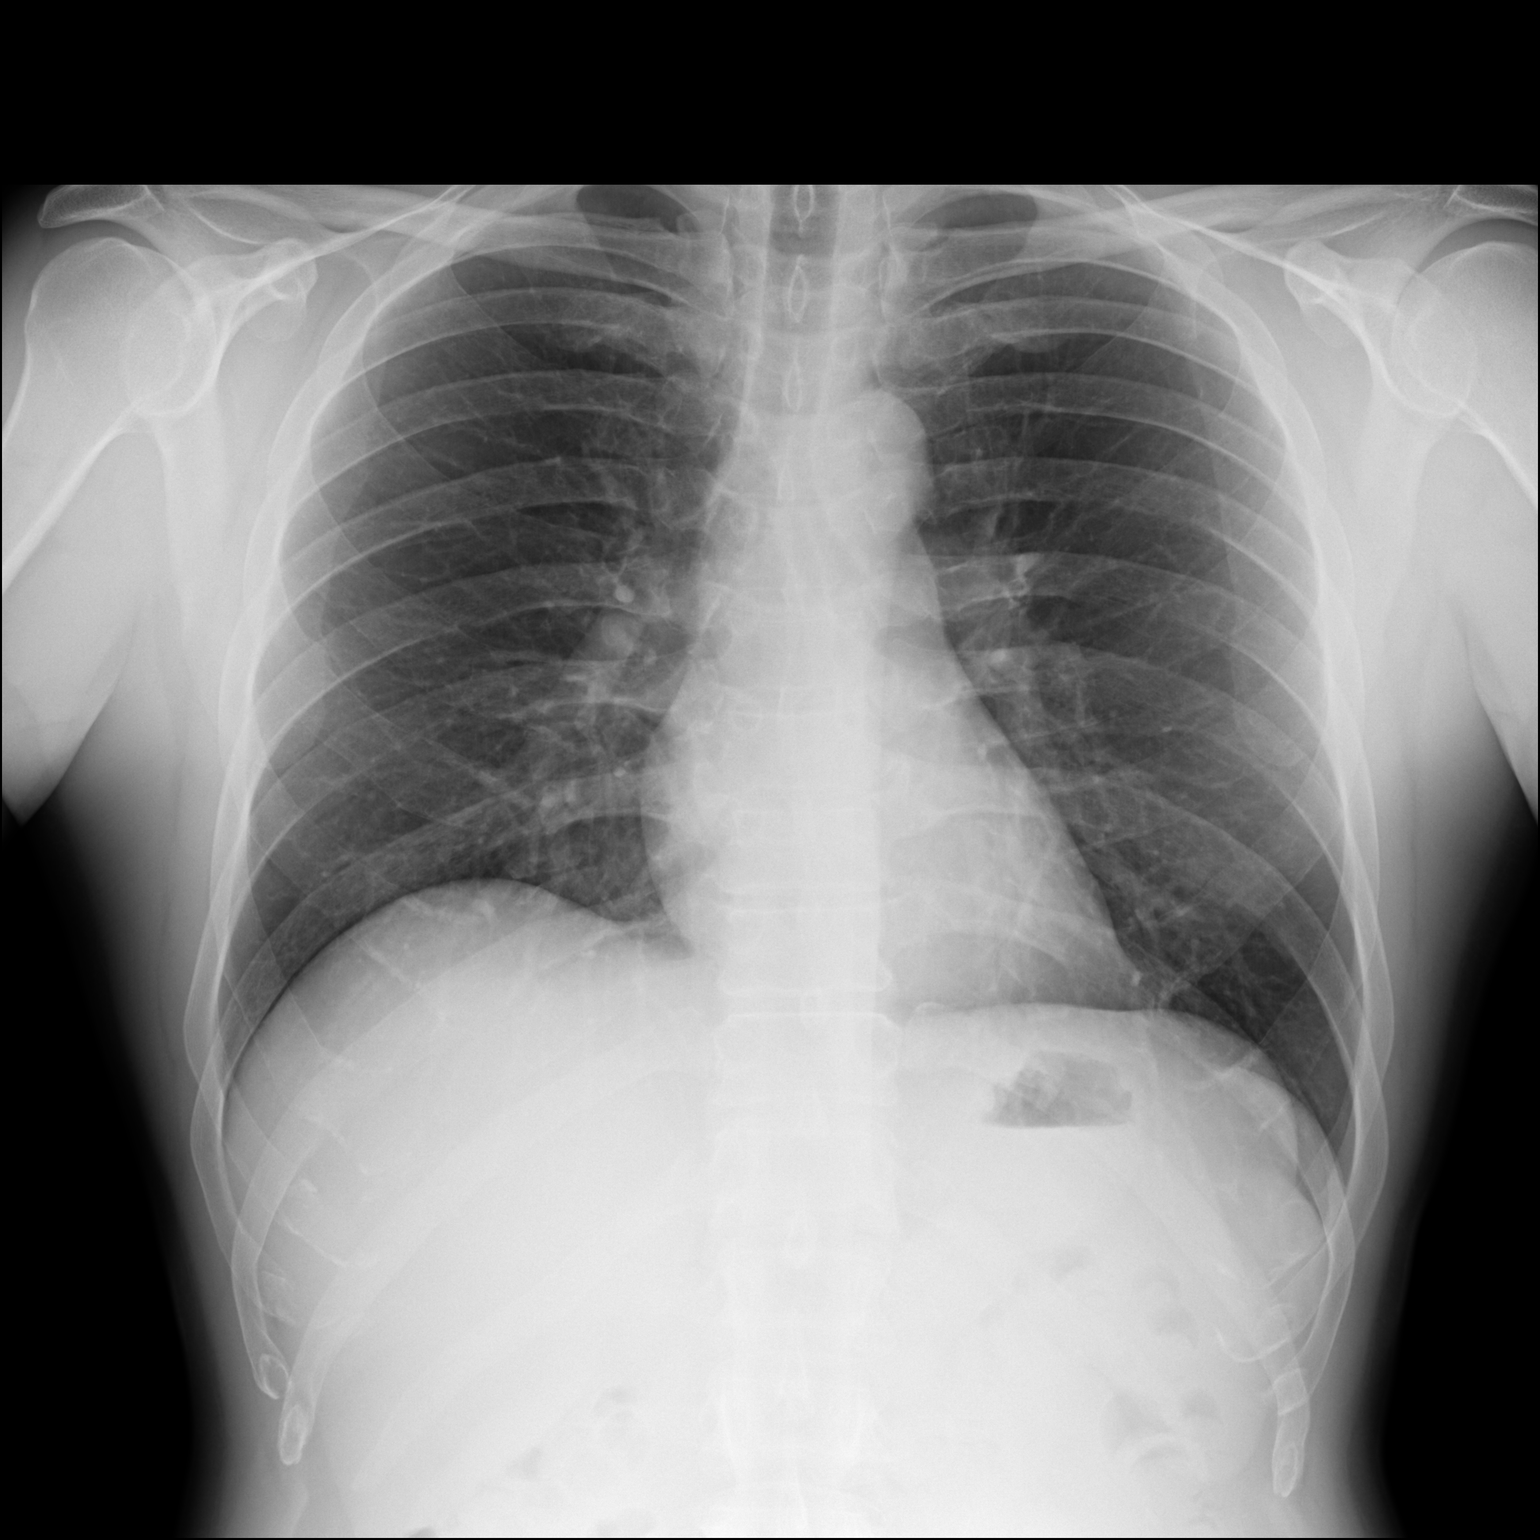

[dg chest 2 view (2 of 2)]
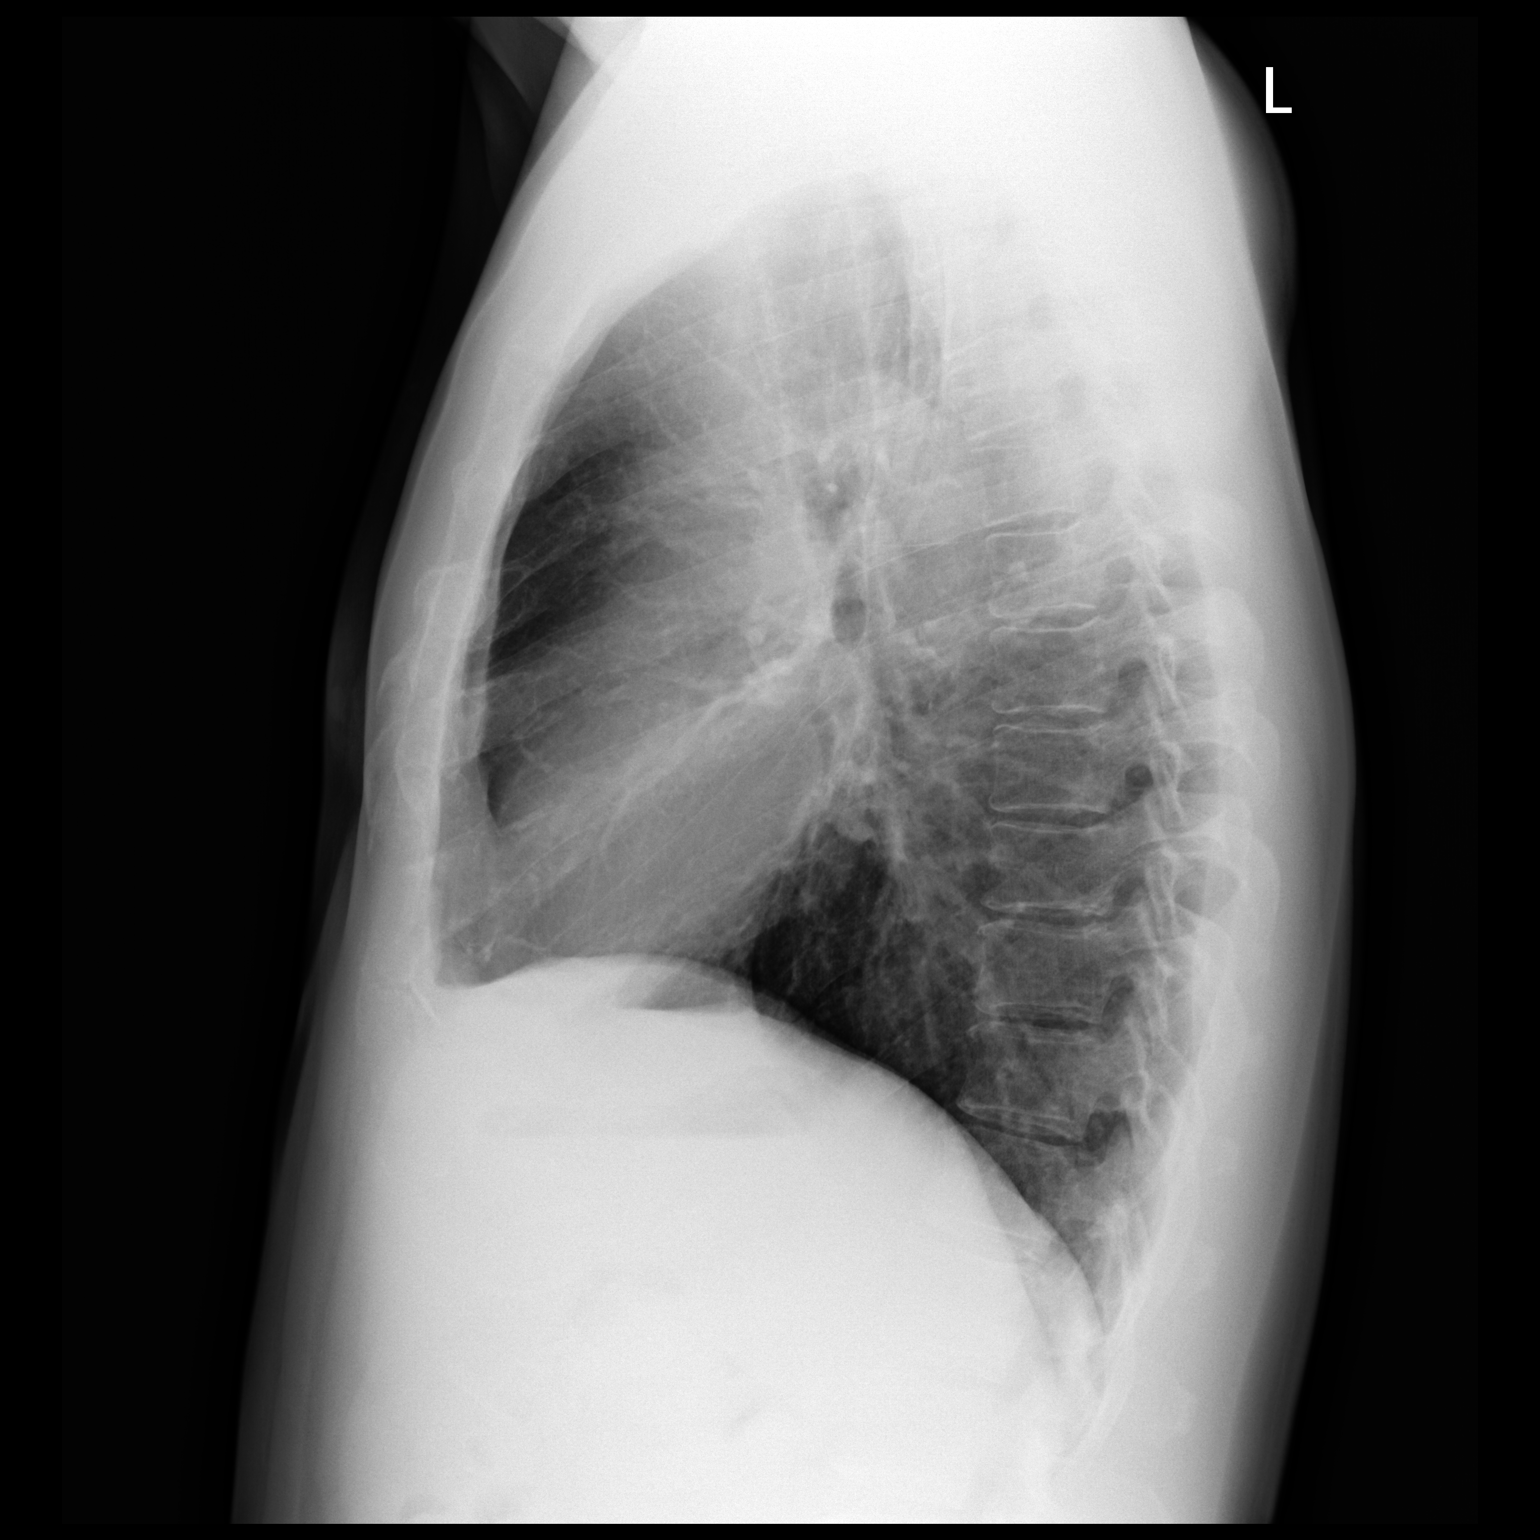

[2 of 2 positions shown; findings below may reference images not displayed]

FINDINGS: The heart size and mediastinal contours are within normal limits.
Both lungs are clear. The visualized skeletal structures are
unremarkable.
IMPRESSION: No acute abnormality of the lungs.

## 2020-07-12 DIAGNOSIS — D7589 Other specified diseases of blood and blood-forming organs: Secondary | ICD-10-CM | POA: Diagnosis not present

## 2020-07-12 DIAGNOSIS — R7401 Elevation of levels of liver transaminase levels: Secondary | ICD-10-CM | POA: Diagnosis not present

## 2020-07-12 DIAGNOSIS — E786 Lipoprotein deficiency: Secondary | ICD-10-CM | POA: Diagnosis not present

## 2020-08-27 DIAGNOSIS — L719 Rosacea, unspecified: Secondary | ICD-10-CM | POA: Diagnosis not present

## 2020-08-27 DIAGNOSIS — Z6823 Body mass index (BMI) 23.0-23.9, adult: Secondary | ICD-10-CM | POA: Diagnosis not present

## 2020-09-12 DIAGNOSIS — Z0000 Encounter for general adult medical examination without abnormal findings: Secondary | ICD-10-CM | POA: Diagnosis not present

## 2020-09-17 DIAGNOSIS — L718 Other rosacea: Secondary | ICD-10-CM | POA: Diagnosis not present

## 2020-10-16 DIAGNOSIS — L718 Other rosacea: Secondary | ICD-10-CM | POA: Diagnosis not present

## 2021-01-01 DIAGNOSIS — J069 Acute upper respiratory infection, unspecified: Secondary | ICD-10-CM | POA: Diagnosis not present

## 2021-01-03 DIAGNOSIS — Z20822 Contact with and (suspected) exposure to covid-19: Secondary | ICD-10-CM | POA: Diagnosis not present

## 2021-01-03 DIAGNOSIS — J069 Acute upper respiratory infection, unspecified: Secondary | ICD-10-CM | POA: Diagnosis not present

## 2021-03-05 DIAGNOSIS — L700 Acne vulgaris: Secondary | ICD-10-CM | POA: Diagnosis not present

## 2021-03-05 DIAGNOSIS — L570 Actinic keratosis: Secondary | ICD-10-CM | POA: Diagnosis not present

## 2021-04-16 ENCOUNTER — Encounter (HOSPITAL_BASED_OUTPATIENT_CLINIC_OR_DEPARTMENT_OTHER): Payer: Self-pay | Admitting: *Deleted

## 2021-04-16 ENCOUNTER — Other Ambulatory Visit: Payer: Self-pay

## 2021-04-16 ENCOUNTER — Emergency Department (HOSPITAL_BASED_OUTPATIENT_CLINIC_OR_DEPARTMENT_OTHER)
Admission: EM | Admit: 2021-04-16 | Discharge: 2021-04-16 | Disposition: A | Discharge status: Home / Self Care | Payer: BC Managed Care – PPO | Attending: Emergency Medicine | Admitting: Emergency Medicine

## 2021-04-16 ENCOUNTER — Emergency Department (HOSPITAL_BASED_OUTPATIENT_CLINIC_OR_DEPARTMENT_OTHER): Payer: BC Managed Care – PPO

## 2021-04-16 DIAGNOSIS — Z87891 Personal history of nicotine dependence: Secondary | ICD-10-CM | POA: Diagnosis not present

## 2021-04-16 DIAGNOSIS — R109 Unspecified abdominal pain: Secondary | ICD-10-CM | POA: Diagnosis not present

## 2021-04-16 DIAGNOSIS — R197 Diarrhea, unspecified: Secondary | ICD-10-CM | POA: Diagnosis not present

## 2021-04-16 DIAGNOSIS — K5792 Diverticulitis of intestine, part unspecified, without perforation or abscess without bleeding: Secondary | ICD-10-CM | POA: Insufficient documentation

## 2021-04-16 DIAGNOSIS — R1032 Left lower quadrant pain: Secondary | ICD-10-CM | POA: Diagnosis not present

## 2021-04-16 HISTORY — DX: Diverticulitis of intestine, part unspecified, without perforation or abscess without bleeding: K57.92

## 2021-04-16 LAB — CBC WITH DIFFERENTIAL/PLATELET
Abs Immature Granulocytes: 0.06 10*3/uL (ref 0.00–0.07)
Basophils Absolute: 0.1 10*3/uL (ref 0.0–0.1)
Basophils Relative: 1 %
Eosinophils Absolute: 0.2 10*3/uL (ref 0.0–0.5)
Eosinophils Relative: 2 %
HCT: 43.7 % (ref 39.0–52.0)
Hemoglobin: 15.4 g/dL (ref 13.0–17.0)
Immature Granulocytes: 1 %
Lymphocytes Relative: 17 %
Lymphs Abs: 1.8 10*3/uL (ref 0.7–4.0)
MCH: 33 pg (ref 26.0–34.0)
MCHC: 35.2 g/dL (ref 30.0–36.0)
MCV: 93.6 fL (ref 80.0–100.0)
Monocytes Absolute: 1.3 10*3/uL — ABNORMAL HIGH (ref 0.1–1.0)
Monocytes Relative: 12 %
Neutro Abs: 7.1 10*3/uL (ref 1.7–7.7)
Neutrophils Relative %: 67 %
Platelets: 235 10*3/uL (ref 150–400)
RBC: 4.67 MIL/uL (ref 4.22–5.81)
RDW: 11.9 % (ref 11.5–15.5)
WBC: 10.6 10*3/uL — ABNORMAL HIGH (ref 4.0–10.5)
nRBC: 0 % (ref 0.0–0.2)

## 2021-04-16 LAB — URINALYSIS, ROUTINE W REFLEX MICROSCOPIC
Bilirubin Urine: NEGATIVE
Glucose, UA: NEGATIVE mg/dL
Hgb urine dipstick: NEGATIVE
Ketones, ur: NEGATIVE mg/dL
Leukocytes,Ua: NEGATIVE
Nitrite: NEGATIVE
Protein, ur: NEGATIVE mg/dL
Specific Gravity, Urine: 1.015 (ref 1.005–1.030)
pH: 6.5 (ref 5.0–8.0)

## 2021-04-16 LAB — BASIC METABOLIC PANEL
Anion gap: 10 (ref 5–15)
BUN: 12 mg/dL (ref 6–20)
CO2: 26 mmol/L (ref 22–32)
Calcium: 9.6 mg/dL (ref 8.9–10.3)
Chloride: 104 mmol/L (ref 98–111)
Creatinine, Ser: 0.82 mg/dL (ref 0.61–1.24)
GFR, Estimated: >60 mL/min (ref >60)
Glucose, Bld: 103 mg/dL — ABNORMAL HIGH (ref 70–99)
Potassium: 3.7 mmol/L (ref 3.5–5.1)
Sodium: 140 mmol/L (ref 135–145)

## 2021-04-16 MED ORDER — METRONIDAZOLE 500 MG PO TABS: 500.0000 mg | ORAL_TABLET | Freq: Two times a day (BID) | ORAL | 0 refills | Status: AC

## 2021-04-16 MED ORDER — IOHEXOL 350 MG/ML SOLN
85.0000 mL | Freq: Once | INTRAVENOUS | Status: AC | PRN
  Administered 2021-04-16: 85 mL via INTRAVENOUS

## 2021-04-16 MED ORDER — CIPROFLOXACIN HCL 500 MG PO TABS: 500.0000 mg | ORAL_TABLET | Freq: Two times a day (BID) | ORAL | 0 refills | Status: AC

## 2021-04-16 NOTE — ED Triage Notes (Signed)
Abdominal pain (LLQ).  History of diverticulitis was placed on Amoxicillin and Clavulanate Potassium for 10 days.  Completed this antibiotic 11 days ago. Came back here for intermittent abdominal pain 4/10

## 2021-04-16 NOTE — Discharge Instructions (Addendum)
It appears that you have recurrence of diverticulitis on your CT scan today.  I have ordered a change in antibiotic medications.  You will be taking 2 antibiotics Cipro and Flagyl for 10 days. If in the next 48 to 72 hours you have no improvement in your symptoms or you are worsening including severe pain pain with any movement of the abdomen fevers uncontrolled nausea or vomiting you need to return to the emergency department immediately.  At that time you will very likely need to be admitted for IV antibiotics.  It is best that you return to either [facility] emergency department or [facility] emergency department as [facility] are freestanding ERs that require a transfer into the hospital which can take a very long time.   Contact a health care provider if: Your pain does not improve. Your bowel movements do not return to normal. Get help right away if: Your pain gets worse. Your symptoms do not get better with treatment. Your symptoms suddenly get worse. You have a fever. You vomit more than one time. You have stools that are bloody, black, or tarry.

## 2021-04-16 NOTE — ED Provider Notes (Signed)
[facility] EMERGENCY DEPT Provider Note   CSN: [id number] Arrival date & time: 04/16/21  1454     History Chief Complaint  Patient presents with   Abdominal Pain    Tyrone Costa is a 50 y.o. male who presents emergency department chief complaint of left lower quadrant abdominal pain.  Patient has a past medical history of diverticulitis.  At the beginning of the month the patient had some left lower quadrant pain which was achy and associated diarrhea.  This is typically how his symptoms started.  He got in touch with his primary care doctor who called in Augmentin.  Patient took the Augmentin for 10 days and it ended 11 days ago.  This morning he began having the same pain in his left lower quadrant along with loose stools all day.  Patient was worried that he has failed therapy and came into the emergency department.  He denies any nausea, vomiting or fevers.   Abdominal Pain     Past Medical History:  Diagnosis Date   Diverticulitis    Herpes     Patient Active Problem List   Diagnosis Date Noted   Diverticulitis 11/01/2015   Sigmoid diverticulitis 11/01/2015    Past Surgical History:  Procedure Laterality Date   genital herpes     left arm surgery         History reviewed. No pertinent family history.  Social History   Tobacco Use   Smoking status: Former   Smokeless tobacco: Never  Building services engineer Use: Never used  Substance Use Topics   Alcohol use: Yes    Comment: socially   Drug use: No    Home Medications Prior to Admission medications   Medication Sig Start Date End Date Taking? Authorizing Provider  ciprofloxacin (CIPRO) 500 MG tablet Take 1 tablet (500 mg total) by mouth every 12 (twelve) hours. 04/16/21  Yes Argyle Gustafson, PA-C  metroNIDAZOLE (FLAGYL) 500 MG tablet Take 1 tablet (500 mg total) by mouth 2 (two) times daily. 04/16/21  Yes Benford Asch, PA-C  valACYclovir (VALTREX) 500 MG tablet Take 500 mg by mouth 2  (two) times daily as needed (outbreaks).   Yes [provider]  Ascorbic Acid (VITAMIN C PO) Take 1 tablet by mouth daily.    [provider]    Allergies    Patient has no known allergies.  Review of Systems   Review of Systems  Gastrointestinal:  Positive for abdominal pain.  Ten systems reviewed and are negative for acute change, except as noted in the HPI.   Physical Exam Updated Vital Signs BP (!) 141/84 (BP Location: Left Arm)   Pulse 75   Temp 98.5 F (36.9 C)   Resp 16   Ht 5\' 8"  (1.727 m)   Wt 74.8 kg   SpO2 99%   BMI 25.09 kg/m   Physical Exam Vitals and nursing note reviewed.  Constitutional:      General: He is not in acute distress.    Appearance: He is well-developed. He is not diaphoretic.  HENT:     Head: Normocephalic and atraumatic.  Eyes:     General: No scleral icterus.    Extraocular Movements: Extraocular movements intact.     Conjunctiva/sclera: Conjunctivae normal.     Pupils: Pupils are equal, round, and reactive to light.  Cardiovascular:     Rate and Rhythm: Normal rate and regular rhythm.     Heart sounds: Normal heart sounds.  Pulmonary:  Effort: Pulmonary effort is normal. No respiratory distress.     Breath sounds: Normal breath sounds.  Abdominal:     Palpations: Abdomen is soft.     Tenderness: There is abdominal tenderness in the left lower quadrant.  Musculoskeletal:     Cervical back: Normal range of motion and neck supple.  Skin:    General: Skin is warm and dry.  Neurological:     Mental Status: He is alert.  Psychiatric:        Behavior: Behavior normal.    ED Results / Procedures / Treatments   Labs (all labs ordered are listed, but only abnormal results are displayed) Labs Reviewed  BASIC METABOLIC PANEL - Abnormal; Notable for the following components:      Result Value   Glucose, Bld 103 (*)    All other components within normal limits  CBC WITH DIFFERENTIAL/PLATELET - Abnormal; Notable for  the following components:   WBC 10.6 (*)    Monocytes Absolute 1.3 (*)    All other components within normal limits  URINALYSIS, ROUTINE W REFLEX MICROSCOPIC - Abnormal; Notable for the following components:   Color, Urine STRAW (*)    All other components within normal limits    EKG None  Radiology CT ABDOMEN PELVIS W CONTRAST  Result Date: 04/16/2021 CLINICAL DATA:  Abdominal pain.  Concern for acute diverticulitis. EXAM: CT ABDOMEN AND PELVIS WITH CONTRAST TECHNIQUE: Multidetector CT imaging of the abdomen and pelvis was performed using the standard protocol following bolus administration of intravenous contrast. CONTRAST:  71mL OMNIPAQUE IOHEXOL 350 MG/ML SOLN COMPARISON:  CT abdomen pelvis dated 11/19/2015. FINDINGS: Lower chest: The visualized lung bases are clear. No intra-abdominal free air or free fluid. Hepatobiliary: Fatty liver. No intrahepatic biliary dilatation. Subcentimeter right hepatic hypodense focus is too small to characterize. The gallbladder is unremarkable. Pancreas: Unremarkable. No pancreatic ductal dilatation or surrounding inflammatory changes. Spleen: Normal in size without focal abnormality. Adrenals/Urinary Tract: The adrenal glands unremarkable. The kidneys, visualized ureters, and bladder are unremarkable Stomach/Bowel: There is sigmoid diverticulosis with active inflammatory changes. No diverticular abscess or perforation. There is no bowel obstruction. The appendix is normal. Vascular/Lymphatic: Mild aortoiliac atherosclerotic disease. The IVC is unremarkable. No portal venous gas. There is no adenopathy. Reproductive: The prostate and seminal vesicles are grossly unremarkable. No pelvic mass. Other: None Musculoskeletal: No acute or significant osseous findings. IMPRESSION: 1. Sigmoid diverticulitis. No diverticular abscess or perforation. 2. Fatty liver. 3. Aortic Atherosclerosis (ICD10-I70.0). Electronically Signed   By: Elgie Collard M.D.   On: 04/16/2021  19:27    Procedures Procedures   Medications Ordered in ED Medications  iohexol (OMNIPAQUE) 350 MG/ML injection 85 mL (85 mLs Intravenous Contrast Given 04/16/21 1840)    ED Course  I have reviewed the triage vital signs and the nursing notes.  Pertinent labs & imaging results that were available during my care of the patient were reviewed by me and considered in my medical decision making (see chart for details).    MDM Rules/Calculators/A&P                           CC:LLQ abdominal pain VS:  Vitals:   04/16/21 1520 04/16/21 1527 04/16/21 1529 04/16/21 1818  BP: (!) 157/104  (!) 141/92 (!) 141/84  Pulse: 84   75  Resp: 18   16  Temp: 98.5 F (36.9 C)     SpO2: 98%   99%  Weight:  74.8 kg  Height:  5\' 8"  (1.727 m)      is gathered by patient  and emr. Previous records obtained and reviewed. DDX:The patient's complaint of LLq pain involves an extensive number of diagnostic and treatment options, and is a complaint that carries with it a high risk of complications, morbidity, and potential mortality. Given the large differential diagnosis, medical decision making is of high complexity. The differential diagnosis for generalized abdominal pain includes, but is not limited to AAA, gastroenteritis, appendicitis, Bowel obstruction, Bowel perforation. Gastroparesis, DKA, Hernia, Inflammatory bowel disease, mesenteric ischemia, pancreatitis, peritonitis SBP, volvulus.  Labs: I ordered reviewed and interpreted labs which include CBC with a very mildly elevated white blood cell count, no left shift, urinalysis without abnormality, BMP with mildly elevated blood glucose of 103 of insignificant value Imaging: I ordered and reviewed images which included CT abdomen pelvis with contrast. I independently visualized and interpreted all imaging. Significant findings include evidence of acute diverticulitis without perforation or abscess.  EKG:  Consults: PX:TGGYIRS with LLQ  abdominal pain.+ diverticulitis. Just completed a course of augmentin. Will switch to cipro/flagyl. + clear liquid diet. Discussed strict return precautions for abx failure. Patient disposition:The patient appears reasonably stabilized for admission considering the current resources, flow, and capabilities available in the ED at this time, and I doubt any other [facility] requiring further screening and/or treatment in the ED prior to admission.       Final Clinical Impression(s) / ED Diagnoses Final diagnoses:  Diverticulitis    Rx / DC Orders ED Discharge Orders          Ordered    ciprofloxacin (CIPRO) 500 MG tablet  Every 12 hours        04/16/21 1932    metroNIDAZOLE (FLAGYL) 500 MG tablet  2 times daily        04/16/21 1932             04/18/21, PA-C 04/16/21 2210    04/18/21, MD 04/17/21 [phone]

## 2021-09-11 DIAGNOSIS — Z0000 Encounter for general adult medical examination without abnormal findings: Secondary | ICD-10-CM | POA: Diagnosis not present

## 2021-09-16 DIAGNOSIS — Z0000 Encounter for general adult medical examination without abnormal findings: Secondary | ICD-10-CM | POA: Diagnosis not present

## 2021-09-23 DIAGNOSIS — K5730 Diverticulosis of large intestine without perforation or abscess without bleeding: Secondary | ICD-10-CM | POA: Diagnosis not present

## 2021-09-23 DIAGNOSIS — K635 Polyp of colon: Secondary | ICD-10-CM | POA: Diagnosis not present

## 2021-09-23 DIAGNOSIS — K648 Other hemorrhoids: Secondary | ICD-10-CM | POA: Diagnosis not present

## 2021-09-23 DIAGNOSIS — Z86010 Personal history of colonic polyps: Secondary | ICD-10-CM | POA: Diagnosis not present

## 2022-02-04 DIAGNOSIS — R109 Unspecified abdominal pain: Secondary | ICD-10-CM | POA: Diagnosis not present

## 2022-02-04 DIAGNOSIS — R1032 Left lower quadrant pain: Secondary | ICD-10-CM | POA: Diagnosis not present

## 2022-02-23 DIAGNOSIS — U071 COVID-19: Secondary | ICD-10-CM | POA: Diagnosis not present

## 2022-02-23 DIAGNOSIS — B349 Viral infection, unspecified: Secondary | ICD-10-CM | POA: Diagnosis not present

## 2022-02-23 DIAGNOSIS — M545 Low back pain, unspecified: Secondary | ICD-10-CM | POA: Diagnosis not present

## 2022-05-12 IMAGING — CT CT ABD-PELV W/ CM
2 of 5 series · 16 of 46 positions shown, 18 images · IV contrast (APPLIED)
Comparison: CT abdomen pelvis dated 11/19/2015.

CLINICAL DATA: Abdominal pain.  Concern for acute diverticulitis.

EXAM:
CT ABDOMEN AND PELVIS WITH CONTRAST
TECHNIQUE: Multidetector CT imaging of the abdomen and pelvis was performed
using the standard protocol following bolus administration of
intravenous contrast.
CONTRAST:  85mL OMNIPAQUE IOHEXOL 350 MG/ML SOLN

[Series 2: abd pel w · axial · 0.68mm/px · z∈[+618,+1028]mm · 13 of 94 slices shown, 15 images]
[im 6/94  soft-tissue]
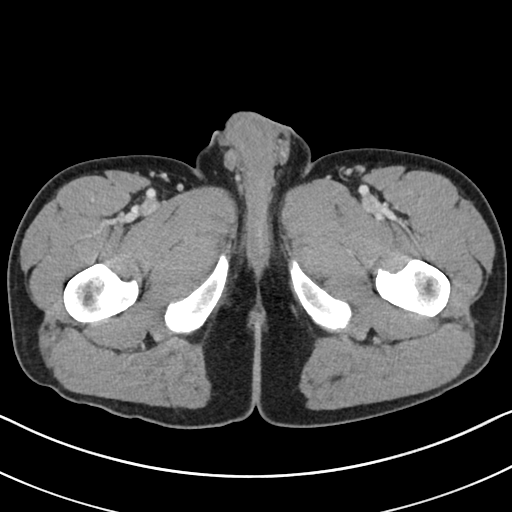
[im 6/94  bone]
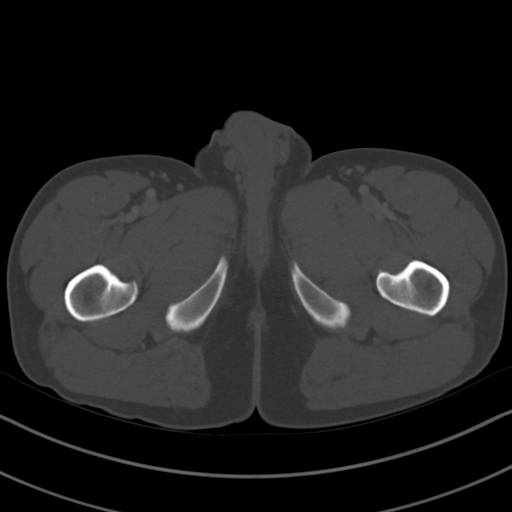
[im 12/94  soft-tissue]
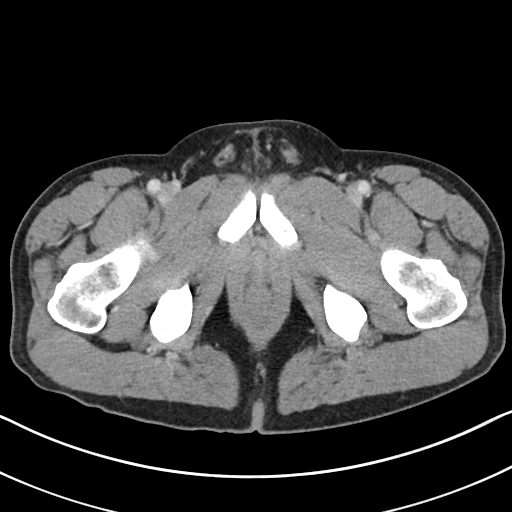
[im 18/94  soft-tissue]
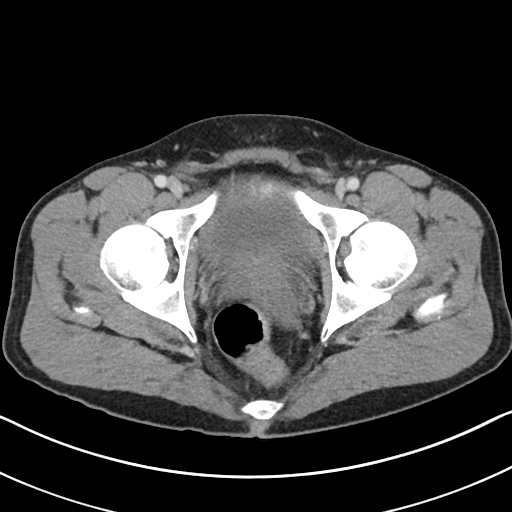
[im 30/94  soft-tissue]
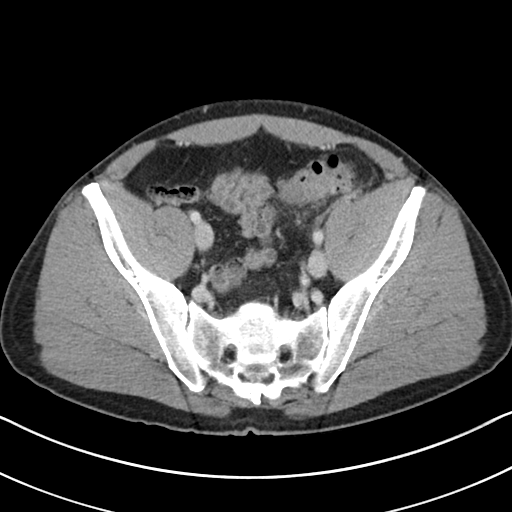
[im 35/94  soft-tissue]
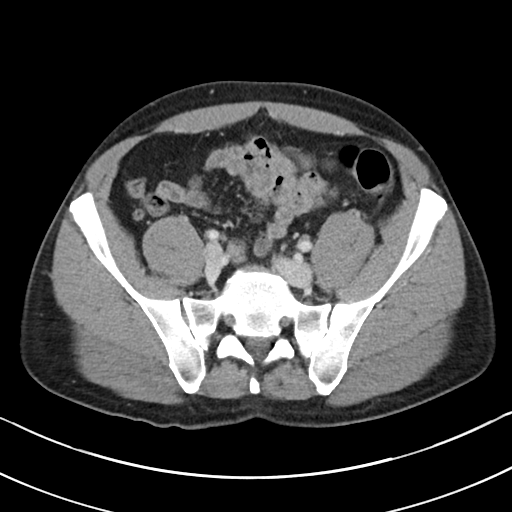
[im 41/94  soft-tissue]
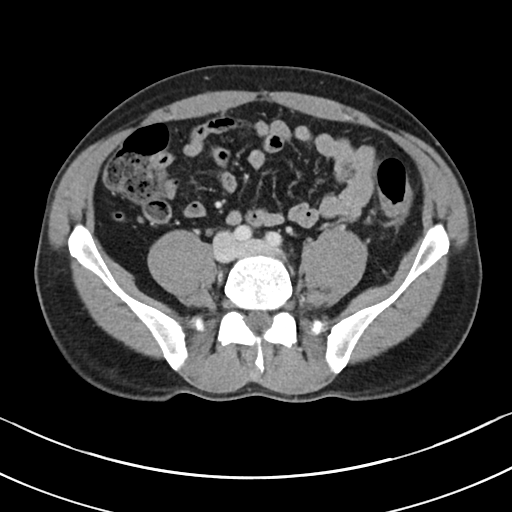
[im 47/94  soft-tissue]
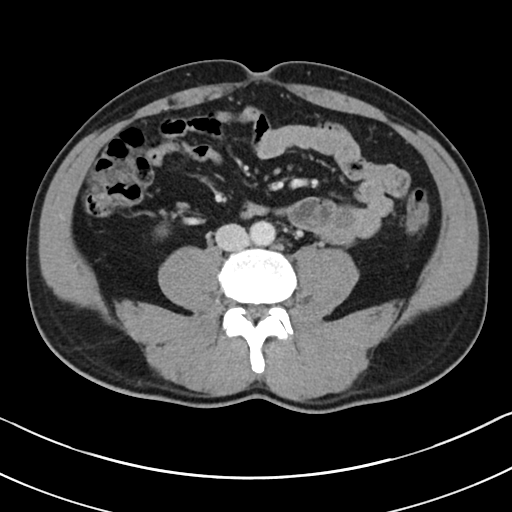
[im 53/94  soft-tissue]
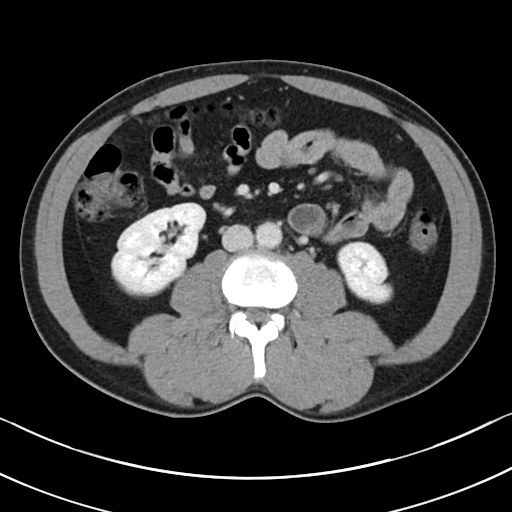
[im 59/94  soft-tissue]
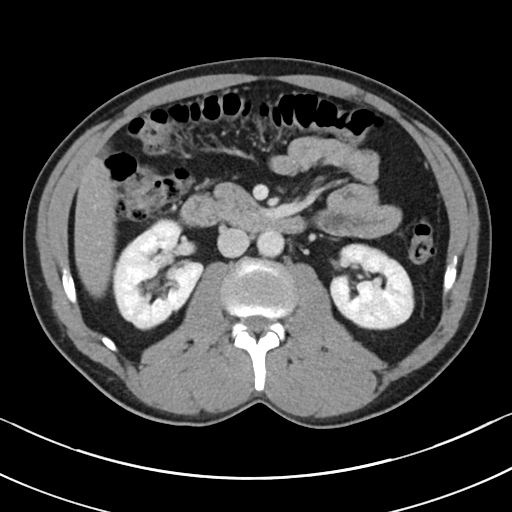
[im 59/94  bone]
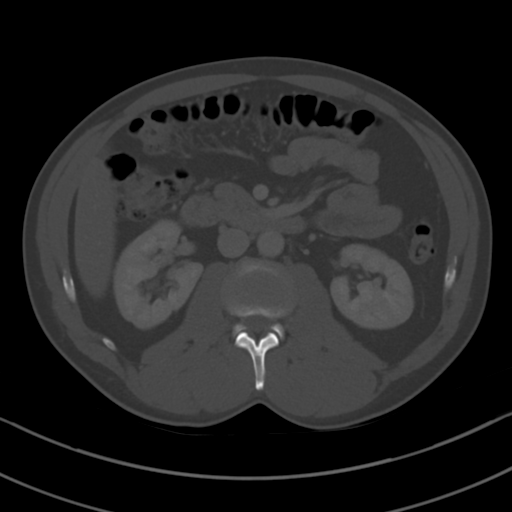
[im 64/94  soft-tissue]
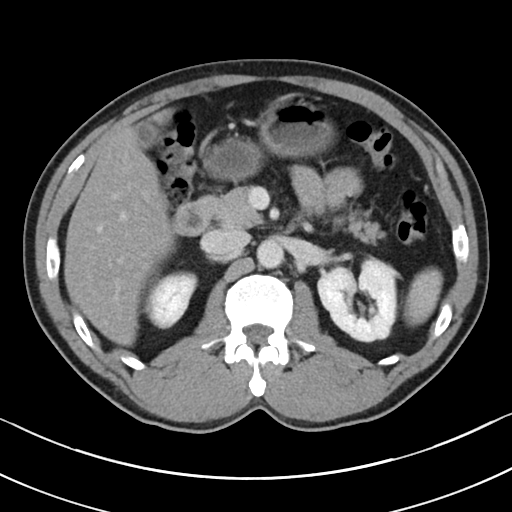
[im 76/94  soft-tissue]
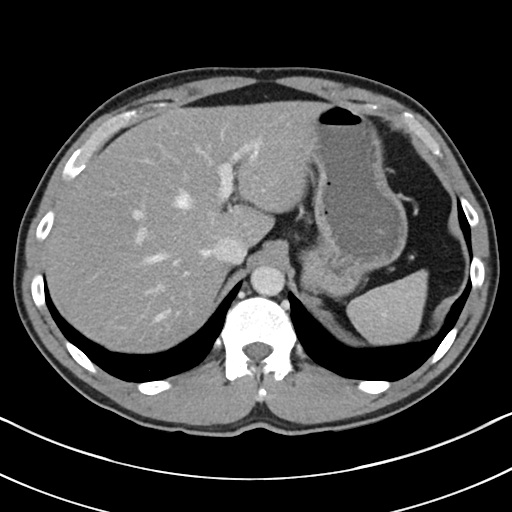
[im 82/94  soft-tissue]
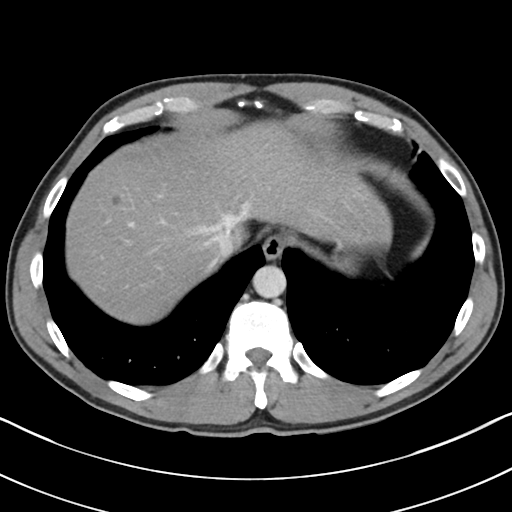
[im 88/94  soft-tissue]
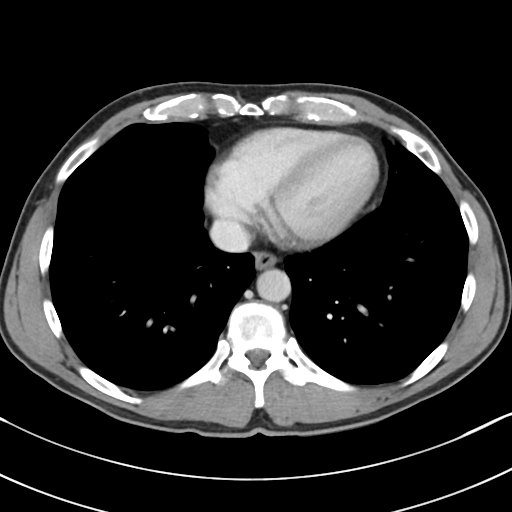

[Series 5: coronal · coronal · 0.72mm/px · 3 of 94 slices shown]
[im 32/94  soft-tissue]
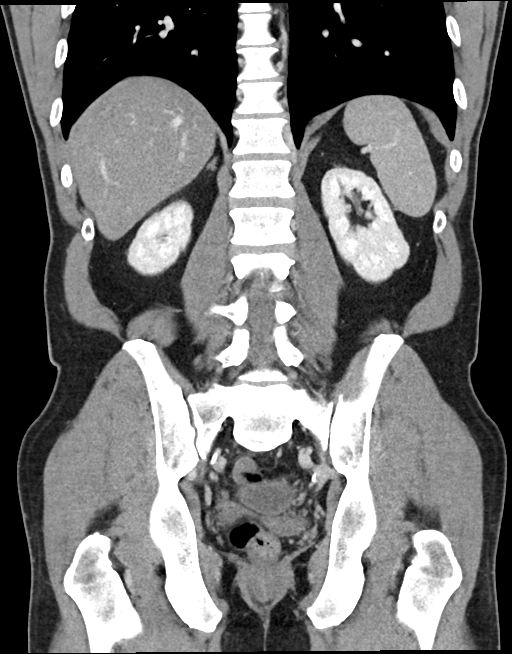
[im 42/94  soft-tissue]
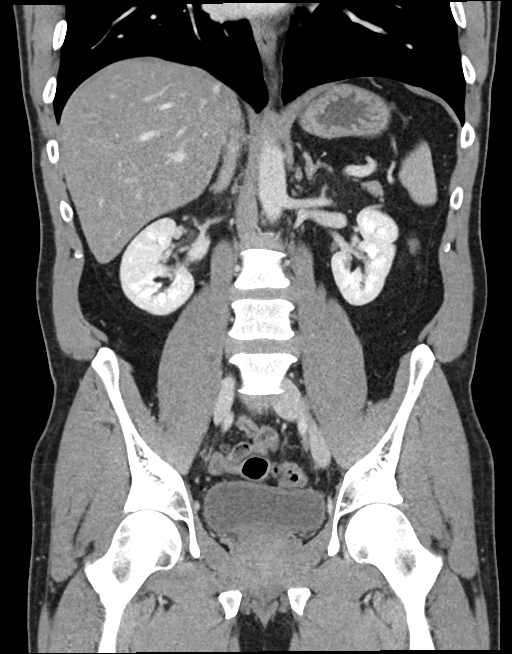
[im 52/94  soft-tissue]
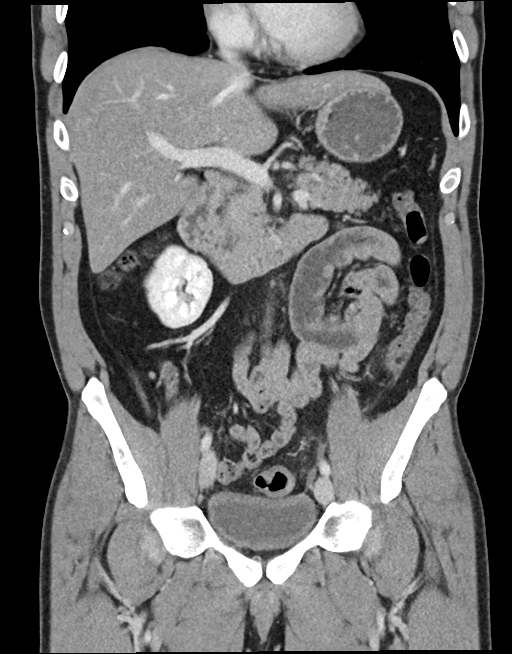

[16 of 46 positions shown; findings below may reference images not displayed]

FINDINGS: Lower chest: The visualized lung bases are clear.

No intra-abdominal free air or free fluid.

Hepatobiliary: Fatty liver. No intrahepatic biliary dilatation.
Subcentimeter right hepatic hypodense focus is too small to
characterize. The gallbladder is unremarkable.

Pancreas: Unremarkable. No pancreatic ductal dilatation or
surrounding inflammatory changes.

Spleen: Normal in size without focal abnormality.

Adrenals/Urinary Tract: The adrenal glands unremarkable. The
kidneys, visualized ureters, and bladder are unremarkable

Stomach/Bowel: There is sigmoid diverticulosis with active
inflammatory changes. No diverticular abscess or perforation. There
is no bowel obstruction. The appendix is normal.

Vascular/Lymphatic: Mild aortoiliac atherosclerotic disease. The IVC
is unremarkable. No portal venous gas. There is no adenopathy.

Reproductive: The prostate and seminal vesicles are grossly
unremarkable. No pelvic mass.

Other: None

Musculoskeletal: No acute or significant osseous findings.
IMPRESSION: 1. Sigmoid diverticulitis. No diverticular abscess or perforation.
2. Fatty liver.
3. Aortic Atherosclerosis ([id number]).

## 2022-08-20 DIAGNOSIS — L718 Other rosacea: Secondary | ICD-10-CM | POA: Diagnosis not present

## 2022-09-18 DIAGNOSIS — Z0000 Encounter for general adult medical examination without abnormal findings: Secondary | ICD-10-CM | POA: Diagnosis not present

## 2022-09-18 DIAGNOSIS — Z125 Encounter for screening for malignant neoplasm of prostate: Secondary | ICD-10-CM | POA: Diagnosis not present

## 2022-09-18 DIAGNOSIS — E786 Lipoprotein deficiency: Secondary | ICD-10-CM | POA: Diagnosis not present

## 2022-09-18 DIAGNOSIS — R7301 Impaired fasting glucose: Secondary | ICD-10-CM | POA: Diagnosis not present

## 2022-09-24 DIAGNOSIS — Z0000 Encounter for general adult medical examination without abnormal findings: Secondary | ICD-10-CM | POA: Diagnosis not present

## 2022-09-30 DIAGNOSIS — R972 Elevated prostate specific antigen [PSA]: Secondary | ICD-10-CM | POA: Diagnosis not present

## 2022-12-08 DIAGNOSIS — L821 Other seborrheic keratosis: Secondary | ICD-10-CM | POA: Diagnosis not present

## 2023-09-24 DIAGNOSIS — D7589 Other specified diseases of blood and blood-forming organs: Secondary | ICD-10-CM | POA: Diagnosis not present

## 2023-09-24 DIAGNOSIS — Z79899 Other long term (current) drug therapy: Secondary | ICD-10-CM | POA: Diagnosis not present

## 2023-09-24 DIAGNOSIS — Z125 Encounter for screening for malignant neoplasm of prostate: Secondary | ICD-10-CM | POA: Diagnosis not present

## 2023-09-24 DIAGNOSIS — E786 Lipoprotein deficiency: Secondary | ICD-10-CM | POA: Diagnosis not present

## 2023-09-29 DIAGNOSIS — Z0000 Encounter for general adult medical examination without abnormal findings: Secondary | ICD-10-CM | POA: Diagnosis not present
